# Patient Record
Sex: Female | Born: 1955 | Race: White | Hispanic: No | Smoking: Former smoker
Health system: Southern US, Community
[De-identification: ages and names within clinical notes are randomized; demographics above are authoritative.]

## PROBLEM LIST (undated history)

## (undated) DIAGNOSIS — E039 Hypothyroidism, unspecified: Secondary | ICD-10-CM

## (undated) DIAGNOSIS — K5720 Diverticulitis of large intestine with perforation and abscess without bleeding: Secondary | ICD-10-CM

## (undated) DIAGNOSIS — G4730 Sleep apnea, unspecified: Secondary | ICD-10-CM

## (undated) DIAGNOSIS — K219 Gastro-esophageal reflux disease without esophagitis: Secondary | ICD-10-CM

## (undated) DIAGNOSIS — I10 Essential (primary) hypertension: Secondary | ICD-10-CM

## (undated) DIAGNOSIS — E119 Type 2 diabetes mellitus without complications: Secondary | ICD-10-CM

## (undated) DIAGNOSIS — K5792 Diverticulitis of intestine, part unspecified, without perforation or abscess without bleeding: Secondary | ICD-10-CM

## (undated) HISTORY — PX: VARICOSE VEIN SURGERY: SHX832

---
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
## 2015-04-04 DIAGNOSIS — R202 Paresthesia of skin: Secondary | ICD-10-CM | POA: Insufficient documentation

## 2015-04-04 DIAGNOSIS — M8589 Other specified disorders of bone density and structure, multiple sites: Secondary | ICD-10-CM | POA: Insufficient documentation

## 2015-04-04 DIAGNOSIS — E785 Hyperlipidemia, unspecified: Secondary | ICD-10-CM | POA: Diagnosis present

## 2015-04-04 DIAGNOSIS — K219 Gastro-esophageal reflux disease without esophagitis: Secondary | ICD-10-CM | POA: Diagnosis present

## 2015-04-04 DIAGNOSIS — F339 Major depressive disorder, recurrent, unspecified: Secondary | ICD-10-CM | POA: Diagnosis present

## 2015-04-04 DIAGNOSIS — I872 Venous insufficiency (chronic) (peripheral): Secondary | ICD-10-CM | POA: Insufficient documentation

## 2015-04-04 DIAGNOSIS — G5601 Carpal tunnel syndrome, right upper limb: Secondary | ICD-10-CM | POA: Insufficient documentation

## 2015-04-04 DIAGNOSIS — I10 Essential (primary) hypertension: Secondary | ICD-10-CM | POA: Diagnosis present

## 2015-04-04 DIAGNOSIS — E1140 Type 2 diabetes mellitus with diabetic neuropathy, unspecified: Secondary | ICD-10-CM

## 2015-04-04 DIAGNOSIS — M722 Plantar fascial fibromatosis: Secondary | ICD-10-CM | POA: Insufficient documentation

## 2015-04-04 DIAGNOSIS — J309 Allergic rhinitis, unspecified: Secondary | ICD-10-CM | POA: Insufficient documentation

## 2015-04-04 DIAGNOSIS — G4700 Insomnia, unspecified: Secondary | ICD-10-CM | POA: Diagnosis present

## 2015-07-21 DIAGNOSIS — I83812 Varicose veins of left lower extremities with pain: Secondary | ICD-10-CM | POA: Insufficient documentation

## 2018-01-04 DIAGNOSIS — I8001 Phlebitis and thrombophlebitis of superficial vessels of right lower extremity: Secondary | ICD-10-CM | POA: Insufficient documentation

## 2018-04-18 DIAGNOSIS — K632 Fistula of intestine: Secondary | ICD-10-CM

## 2018-04-18 HISTORY — DX: Fistula of intestine: K63.2

## 2018-07-18 DIAGNOSIS — K5792 Diverticulitis of intestine, part unspecified, without perforation or abscess without bleeding: Secondary | ICD-10-CM

## 2018-07-18 HISTORY — DX: Diverticulitis of intestine, part unspecified, without perforation or abscess without bleeding: K57.92

## 2018-08-01 ENCOUNTER — Encounter (HOSPITAL_BASED_OUTPATIENT_CLINIC_OR_DEPARTMENT_OTHER): Payer: Self-pay

## 2018-08-01 ENCOUNTER — Other Ambulatory Visit: Payer: Self-pay

## 2018-08-01 ENCOUNTER — Emergency Department (HOSPITAL_BASED_OUTPATIENT_CLINIC_OR_DEPARTMENT_OTHER): Payer: PRIVATE HEALTH INSURANCE

## 2018-08-01 ENCOUNTER — Inpatient Hospital Stay (HOSPITAL_BASED_OUTPATIENT_CLINIC_OR_DEPARTMENT_OTHER)
Admission: EM | Admit: 2018-08-01 | Discharge: 2018-08-03 | DRG: 392 | Disposition: A | Discharge status: Home / Self Care | Payer: PRIVATE HEALTH INSURANCE | Attending: Internal Medicine | Admitting: Internal Medicine

## 2018-08-01 DIAGNOSIS — K5792 Diverticulitis of intestine, part unspecified, without perforation or abscess without bleeding: Secondary | ICD-10-CM | POA: Diagnosis present

## 2018-08-01 DIAGNOSIS — I10 Essential (primary) hypertension: Secondary | ICD-10-CM | POA: Diagnosis present

## 2018-08-01 DIAGNOSIS — E785 Hyperlipidemia, unspecified: Secondary | ICD-10-CM | POA: Diagnosis present

## 2018-08-01 DIAGNOSIS — E119 Type 2 diabetes mellitus without complications: Secondary | ICD-10-CM | POA: Diagnosis present

## 2018-08-01 DIAGNOSIS — Z833 Family history of diabetes mellitus: Secondary | ICD-10-CM

## 2018-08-01 DIAGNOSIS — Z888 Allergy status to other drugs, medicaments and biological substances status: Secondary | ICD-10-CM

## 2018-08-01 DIAGNOSIS — K5732 Diverticulitis of large intestine without perforation or abscess without bleeding: Secondary | ICD-10-CM | POA: Diagnosis not present

## 2018-08-01 DIAGNOSIS — K5720 Diverticulitis of large intestine with perforation and abscess without bleeding: Secondary | ICD-10-CM | POA: Diagnosis present

## 2018-08-01 DIAGNOSIS — R1032 Left lower quadrant pain: Secondary | ICD-10-CM | POA: Diagnosis not present

## 2018-08-01 DIAGNOSIS — R112 Nausea with vomiting, unspecified: Secondary | ICD-10-CM | POA: Diagnosis present

## 2018-08-01 DIAGNOSIS — Z8249 Family history of ischemic heart disease and other diseases of the circulatory system: Secondary | ICD-10-CM

## 2018-08-01 DIAGNOSIS — E1140 Type 2 diabetes mellitus with diabetic neuropathy, unspecified: Secondary | ICD-10-CM

## 2018-08-01 HISTORY — DX: Type 2 diabetes mellitus without complications: E11.9

## 2018-08-01 HISTORY — DX: Hypothyroidism, unspecified: E03.9

## 2018-08-01 HISTORY — DX: Diverticulitis of intestine, part unspecified, without perforation or abscess without bleeding: K57.92

## 2018-08-01 HISTORY — DX: Essential (primary) hypertension: I10

## 2018-08-01 LAB — COMPREHENSIVE METABOLIC PANEL
ALT: 33 U/L (ref 0–44)
AST: 19 U/L (ref 15–41)
Albumin: 4.3 g/dL (ref 3.5–5.0)
Alkaline Phosphatase: 79 U/L (ref 38–126)
Anion gap: 11 (ref 5–15)
BUN: 15 mg/dL (ref 8–23)
CO2: 26 mmol/L (ref 22–32)
Calcium: 8.8 mg/dL — ABNORMAL LOW (ref 8.9–10.3)
Chloride: 97 mmol/L — ABNORMAL LOW (ref 98–111)
Creatinine, Ser: 0.54 mg/dL (ref 0.44–1.00)
GFR calc Af Amer: >60 mL/min (ref >60)
GFR calc non Af Amer: >60 mL/min (ref >60)
Glucose, Bld: 125 mg/dL — ABNORMAL HIGH (ref 70–99)
Potassium: 4 mmol/L (ref 3.5–5.1)
Sodium: 134 mmol/L — ABNORMAL LOW (ref 135–145)
Total Bilirubin: 1.2 mg/dL (ref 0.3–1.2)
Total Protein: 8.1 g/dL (ref 6.5–8.1)

## 2018-08-01 LAB — CBC
HCT: 46.6 % — ABNORMAL HIGH (ref 36.0–46.0)
Hemoglobin: 14.8 g/dL (ref 12.0–15.0)
MCH: 26.8 pg (ref 26.0–34.0)
MCHC: 31.8 g/dL (ref 30.0–36.0)
MCV: 84.4 fL (ref 80.0–100.0)
Platelets: 298 10*3/uL (ref 150–400)
RBC: 5.52 MIL/uL — ABNORMAL HIGH (ref 3.87–5.11)
RDW: 14.3 % (ref 11.5–15.5)
WBC: 17.6 10*3/uL — ABNORMAL HIGH (ref 4.0–10.5)
nRBC: 0 % (ref 0.0–0.2)

## 2018-08-01 LAB — URINALYSIS, ROUTINE W REFLEX MICROSCOPIC
Bilirubin Urine: NEGATIVE
Glucose, UA: >=500 mg/dL — AB
Hgb urine dipstick: NEGATIVE
Ketones, ur: NEGATIVE mg/dL
Leukocytes,Ua: NEGATIVE
Nitrite: NEGATIVE
Protein, ur: NEGATIVE mg/dL
Specific Gravity, Urine: 1.015 (ref 1.005–1.030)
pH: 7.5 (ref 5.0–8.0)

## 2018-08-01 LAB — URINALYSIS, MICROSCOPIC (REFLEX): WBC, UA: NONE SEEN WBC/hpf (ref 0–5)

## 2018-08-01 LAB — LIPASE, BLOOD: Lipase: 30 U/L (ref 11–51)

## 2018-08-01 LAB — LACTIC ACID, PLASMA: Lactic Acid, Venous: 1.5 mmol/L (ref 0.5–1.9)

## 2018-08-01 MED ORDER — SODIUM CHLORIDE 0.9 % IV SOLN
Freq: Once | INTRAVENOUS | Status: AC
  Administered 2018-08-01: 21:00:00 via INTRAVENOUS

## 2018-08-01 MED ORDER — METRONIDAZOLE IN NACL 5-0.79 MG/ML-% IV SOLN
500.0000 mg | Freq: Once | INTRAVENOUS | Status: AC
  Administered 2018-08-01: 23:00:00 500 mg via INTRAVENOUS
  Filled 2018-08-01: qty 100

## 2018-08-01 MED ORDER — IOHEXOL 300 MG/ML  SOLN
100.0000 mL | Freq: Once | INTRAMUSCULAR | Status: AC | PRN
  Administered 2018-08-01: 21:00:00 100 mL via INTRAVENOUS

## 2018-08-01 MED ORDER — MORPHINE SULFATE (PF) 4 MG/ML IV SOLN
4.0000 mg | Freq: Once | INTRAVENOUS | Status: AC
  Administered 2018-08-01: 23:00:00 4 mg via INTRAVENOUS
  Filled 2018-08-01: qty 1

## 2018-08-01 MED ORDER — ONDANSETRON HCL 4 MG/2ML IJ SOLN
4.0000 mg | Freq: Once | INTRAMUSCULAR | Status: AC | PRN
  Administered 2018-08-01: 20:00:00 4 mg via INTRAVENOUS
  Filled 2018-08-01: qty 2

## 2018-08-01 MED ORDER — MORPHINE SULFATE (PF) 2 MG/ML IV SOLN
2.0000 mg | Freq: Once | INTRAVENOUS | Status: AC
  Administered 2018-08-01: 21:00:00 2 mg via INTRAVENOUS
  Filled 2018-08-01: qty 1

## 2018-08-01 MED ORDER — LEVOFLOXACIN IN D5W 750 MG/150ML IV SOLN
750.0000 mg | Freq: Once | INTRAVENOUS | Status: AC
  Administered 2018-08-01: 750 mg via INTRAVENOUS
  Filled 2018-08-01: qty 150

## 2018-08-01 NOTE — Plan of Care (Signed)
Acute uncomplicated diverticulitis.  I recommended against admission if there wernt any complicating factors given the current COVID-19 pandemic and risk of nosocomial infection, but EDP still wants admission for pain control.  As I am not there and cannot evaluate the patient, will therefore accept to Med surg, obs.

## 2018-08-01 NOTE — ED Provider Notes (Signed)
[facility] EMERGENCY DEPARTMENT Provider Note   CSN: [id number] Arrival date & time: 08/01/18  1949    History   Chief Complaint Chief Complaint  Patient presents with  . Abdominal Pain    HPI Kristie Allen is a 63 y.o. female.     HPI Patient has had approximately 3 days of increasing left lower abdominal pain.  On at onset it was moderate discomfort.  She reports it has gotten significantly more painful.  Now it aches from her lower left abdomen up to the mid abdomen.  No vomiting, no diarrhea.  No history of prior similar abdominal pain.  No pain or burning with urgency urination.  No flank pain. Past Medical History:  Diagnosis Date  . COPD (chronic obstructive pulmonary disease) ([place])   . Diabetes mellitus without complication ([place])   . Hypertension     Patient Active Problem List   Diagnosis Date Noted  . Acute diverticulitis 08/01/2018       OB History   No obstetric history on file.      Home Medications    Prior to Admission medications   Not on File    Family History No family history on file.  Social History Social History   Tobacco Use  . Smoking status: Never Smoker  Substance Use Topics  . Alcohol use: Never    Frequency: Never  . Drug use: Never     Allergies   Patient has no allergy information on record.   Review of Systems Review of Systems 10 Systems reviewed and are negative for acute change except as noted in the HPI.   Physical Exam Updated Vital Signs BP (!) 142/75   Pulse 97   Temp 98.7 F (37.1 C) (Oral)   Resp 19   Ht 5\' 5"  (1.651 m)   Wt 117.9 kg   SpO2 91%   BMI 43.27 kg/m   Physical Exam Constitutional:      Comments: Alert and appropriate.  Uncomfortable in appearance.  HENT:     Head: Normocephalic and atraumatic.  Eyes:     Extraocular Movements: Extraocular movements intact.     Conjunctiva/sclera: Conjunctivae normal.  Cardiovascular:     Rate and Rhythm: Normal rate and regular  rhythm.  Pulmonary:     Effort: Pulmonary effort is normal.     Breath sounds: Normal breath sounds.  Abdominal:     Comments: Severe pain left lower quadrant with voluntary guarding.  Moderate pain left mid quadrant.  Right upper quadrant nontender.  Right lower quadrant radiates pain to the left.  Musculoskeletal: Normal range of motion.        General: No swelling or tenderness.     Right lower leg: No edema.     Left lower leg: No edema.  Skin:    General: Skin is warm and dry.  Neurological:     General: No focal deficit present.     Mental Status: She is oriented to person, place, and time.     Coordination: Coordination normal.  Psychiatric:        Mood and Affect: Mood normal.      ED Treatments / Results  Labs (all labs ordered are listed, but only abnormal results are displayed) Labs Reviewed  COMPREHENSIVE METABOLIC PANEL - Abnormal; Notable for the following components:      Result Value   Sodium 134 (*)    Chloride 97 (*)    Glucose, Bld 125 (*)    Calcium 8.8 (*)  All other components within normal limits  CBC - Abnormal; Notable for the following components:   WBC 17.6 (*)    RBC 5.52 (*)    HCT 46.6 (*)    All other components within normal limits  URINALYSIS, ROUTINE W REFLEX MICROSCOPIC - Abnormal; Notable for the following components:   Glucose, UA >=500 (*)    All other components within normal limits  URINALYSIS, MICROSCOPIC (REFLEX) - Abnormal; Notable for the following components:   Bacteria, UA RARE (*)    All other components within normal limits  LIPASE, BLOOD  LACTIC ACID, PLASMA  LACTIC ACID, PLASMA    EKG None  Radiology Ct Abdomen Pelvis W Contrast  Result Date: 08/01/2018 CLINICAL DATA:  63 year old female with abdominal pain and vomit EXAM: CT ABDOMEN AND PELVIS WITH CONTRAST TECHNIQUE: Multidetector CT imaging of the abdomen and pelvis was performed using the standard protocol following bolus administration of intravenous  contrast. CONTRAST:  154mL OMNIPAQUE IOHEXOL 300 MG/ML  SOLN COMPARISON:  None. FINDINGS: Lower chest: No acute finding Hepatobiliary: Unremarkable liver.  Unremarkable gallbladder Pancreas: Unremarkable Spleen: Unremarkable Adrenals/Urinary Tract: Unremarkable appearance of the adrenal glands. No evidence of hydronephrosis of the right or left kidney. No nephrolithiasis. Unremarkable course of the bilateral ureters. Unremarkable appearance of the urinary bladder. Stomach/Bowel: Unremarkable stomach. Hiatal hernia. Small bowel without distention or transition point. Normal appendix. Moderate stool burden of the proximal colon. Circumferential inflammatory changes in the proximal segment of sigmoid colon. This is in a segment of significant diverticular change. Inflammatory changes of the adjacent fat. Free fluid on the anti mesenteric border of the colon, with fluid layering dependently in the pelvis. No free air or abscess. Vascular/Lymphatic: Mild atherosclerotic changes. No aneurysm. Bilateral iliac arteries and proximal femoral arteries patent. Reproductive: Unremarkable uterus Other: None Musculoskeletal: No displaced fracture. Degenerative changes of L5-S1. IMPRESSION: Acute diverticulitis, uncomplicated. Hiatal hernia. Electronically Signed   By: Corrie Mckusick D.O.   On: 08/01/2018 21:45    Procedures Procedures (including critical care time)  Medications Ordered in ED Medications  levofloxacin (LEVAQUIN) IVPB 750 mg (has no administration in time range)  metroNIDAZOLE (FLAGYL) IVPB 500 mg ( Intravenous Rate/Dose Verify 08/01/18 2312)  ondansetron (ZOFRAN) injection 4 mg (4 mg Intravenous Given 08/01/18 2012)  morphine 2 MG/ML injection 2 mg (2 mg Intravenous Given 08/01/18 2038)  0.9 %  sodium chloride infusion ( Intravenous New Bag/Given 08/01/18 2049)  iohexol (OMNIPAQUE) 300 MG/ML solution 100 mL (100 mLs Intravenous Contrast Given 08/01/18 2124)  morphine 4 MG/ML injection 4 mg (4 mg Intravenous  Given 08/01/18 2259)     Initial Impression / Assessment and Plan / ED Course  I have reviewed the triage vital signs and the nursing notes.  Pertinent labs & imaging results that were available during my care of the patient were reviewed by me and considered in my medical decision making (see chart for details).         Consult: Reviewed with Dr. Barry Dienes.  Dr. Barry Dienes has examined the CT.  At this time, pelvic fluid identified on CT not likely to be from abscess.  Proceed with admission with antibiotics, fluids and pain control.  Reconsult surgery tomorrow if concerns.  Consult: Dr. Alcario Drought accepts for admission.  Patient presents with left lower quadrant pain that has been worsening.  CT shows diverticulitis, patient has 17,000 white count.  She does have significant pain to palpation.  Patient needing pain control with morphine.  Final Clinical Impressions(s) / ED Diagnoses  Final diagnoses:  Diverticulitis    ED Discharge Orders    None       Charlesetta Shanks, MD 08/01/18 2326

## 2018-08-01 NOTE — ED Triage Notes (Signed)
Pt reports RLQ and LLQ pain. Nasuea/ 1 episode of vomiting. Denies diarrhea/constipation. Pt denies urinary symptoms. Symptoms began Monday.

## 2018-08-01 NOTE — ED Notes (Signed)
Report given to [facility] w/ [facility]

## 2018-08-02 ENCOUNTER — Encounter (HOSPITAL_COMMUNITY): Payer: Self-pay | Admitting: Internal Medicine

## 2018-08-02 DIAGNOSIS — E119 Type 2 diabetes mellitus without complications: Secondary | ICD-10-CM | POA: Diagnosis not present

## 2018-08-02 DIAGNOSIS — K5792 Diverticulitis of intestine, part unspecified, without perforation or abscess without bleeding: Secondary | ICD-10-CM | POA: Diagnosis not present

## 2018-08-02 DIAGNOSIS — E785 Hyperlipidemia, unspecified: Secondary | ICD-10-CM | POA: Diagnosis not present

## 2018-08-02 DIAGNOSIS — I10 Essential (primary) hypertension: Secondary | ICD-10-CM | POA: Diagnosis not present

## 2018-08-02 DIAGNOSIS — K5732 Diverticulitis of large intestine without perforation or abscess without bleeding: Secondary | ICD-10-CM | POA: Diagnosis present

## 2018-08-02 DIAGNOSIS — Z833 Family history of diabetes mellitus: Secondary | ICD-10-CM | POA: Diagnosis not present

## 2018-08-02 DIAGNOSIS — Z8249 Family history of ischemic heart disease and other diseases of the circulatory system: Secondary | ICD-10-CM | POA: Diagnosis not present

## 2018-08-02 DIAGNOSIS — R1032 Left lower quadrant pain: Secondary | ICD-10-CM | POA: Diagnosis present

## 2018-08-02 DIAGNOSIS — R112 Nausea with vomiting, unspecified: Secondary | ICD-10-CM | POA: Diagnosis present

## 2018-08-02 DIAGNOSIS — Z888 Allergy status to other drugs, medicaments and biological substances status: Secondary | ICD-10-CM | POA: Diagnosis not present

## 2018-08-02 LAB — GLUCOSE, CAPILLARY
Glucose-Capillary: 162 mg/dL — ABNORMAL HIGH (ref 70–99)
Glucose-Capillary: 113 mg/dL — ABNORMAL HIGH (ref 70–99)
Glucose-Capillary: 102 mg/dL — ABNORMAL HIGH (ref 70–99)
Glucose-Capillary: 159 mg/dL — ABNORMAL HIGH (ref 70–99)

## 2018-08-02 LAB — CBC
HCT: 43.2 % (ref 36.0–46.0)
Hemoglobin: 13.7 g/dL (ref 12.0–15.0)
MCH: 26.1 pg (ref 26.0–34.0)
MCHC: 31.7 g/dL (ref 30.0–36.0)
MCV: 82.4 fL (ref 80.0–100.0)
Platelets: 239 10*3/uL (ref 150–400)
RBC: 5.24 MIL/uL — ABNORMAL HIGH (ref 3.87–5.11)
RDW: 14.1 % (ref 11.5–15.5)
WBC: 18.1 10*3/uL — ABNORMAL HIGH (ref 4.0–10.5)
nRBC: 0 % (ref 0.0–0.2)

## 2018-08-02 LAB — BASIC METABOLIC PANEL
Anion gap: 14 (ref 5–15)
BUN: 10 mg/dL (ref 8–23)
CO2: 22 mmol/L (ref 22–32)
Calcium: 8.8 mg/dL — ABNORMAL LOW (ref 8.9–10.3)
Chloride: 98 mmol/L (ref 98–111)
Creatinine, Ser: 0.7 mg/dL (ref 0.44–1.00)
GFR calc Af Amer: >60 mL/min (ref >60)
GFR calc non Af Amer: >60 mL/min (ref >60)
Glucose, Bld: 160 mg/dL — ABNORMAL HIGH (ref 70–99)
Potassium: 3.6 mmol/L (ref 3.5–5.1)
Sodium: 134 mmol/L — ABNORMAL LOW (ref 135–145)

## 2018-08-02 LAB — HIV ANTIBODY (ROUTINE TESTING W REFLEX): HIV Screen 4th Generation wRfx: NONREACTIVE

## 2018-08-02 MED ORDER — EZETIMIBE 10 MG PO TABS
10.0000 mg | ORAL_TABLET | Freq: Every day | ORAL | Status: DC
  Administered 2018-08-02 – 2018-08-03 (×2): 10 mg via ORAL
  Filled 2018-08-02 (×2): qty 1

## 2018-08-02 MED ORDER — ONDANSETRON HCL 4 MG/2ML IJ SOLN
INTRAMUSCULAR | Status: AC
  Filled 2018-08-02: qty 2

## 2018-08-02 MED ORDER — PANTOPRAZOLE SODIUM 40 MG PO TBEC
40.0000 mg | DELAYED_RELEASE_TABLET | Freq: Every day | ORAL | Status: DC
  Administered 2018-08-02 – 2018-08-03 (×2): 40 mg via ORAL
  Filled 2018-08-02 (×2): qty 1

## 2018-08-02 MED ORDER — CIPROFLOXACIN IN D5W 400 MG/200ML IV SOLN: 400.0000 mg | Freq: Two times a day (BID) | INTRAVENOUS | Status: DC

## 2018-08-02 MED ORDER — ENOXAPARIN SODIUM 40 MG/0.4ML ~~LOC~~ SOLN
40.0000 mg | SUBCUTANEOUS | Status: DC
  Filled 2018-08-02: qty 0.4

## 2018-08-02 MED ORDER — METRONIDAZOLE 500 MG PO TABS
500.0000 mg | ORAL_TABLET | Freq: Three times a day (TID) | ORAL | Status: DC
  Administered 2018-08-02 – 2018-08-03 (×5): 500 mg via ORAL
  Filled 2018-08-02 (×5): qty 1

## 2018-08-02 MED ORDER — LEVOTHYROXINE SODIUM 25 MCG PO TABS
125.0000 ug | ORAL_TABLET | Freq: Every day | ORAL | Status: DC
  Administered 2018-08-02 – 2018-08-03 (×2): 125 ug via ORAL
  Filled 2018-08-02 (×2): qty 1

## 2018-08-02 MED ORDER — SODIUM CHLORIDE 0.9 % IV SOLN
2.0000 g | INTRAVENOUS | Status: DC
  Administered 2018-08-02: 2 g via INTRAVENOUS
  Filled 2018-08-02 (×2): qty 20

## 2018-08-02 MED ORDER — MORPHINE SULFATE (PF) 2 MG/ML IV SOLN: 2.0000 mg | INTRAVENOUS | Status: DC | PRN

## 2018-08-02 MED ORDER — ACETAMINOPHEN 650 MG RE SUPP: 650.0000 mg | Freq: Four times a day (QID) | RECTAL | Status: DC | PRN

## 2018-08-02 MED ORDER — ATORVASTATIN CALCIUM 80 MG PO TABS
80.0000 mg | ORAL_TABLET | Freq: Every day | ORAL | Status: DC
  Administered 2018-08-02: 19:00:00 80 mg via ORAL
  Filled 2018-08-02 (×2): qty 1

## 2018-08-02 MED ORDER — ONDANSETRON HCL 4 MG PO TABS: 4.0000 mg | ORAL_TABLET | Freq: Four times a day (QID) | ORAL | Status: DC | PRN

## 2018-08-02 MED ORDER — MELOXICAM 7.5 MG PO TABS
7.5000 mg | ORAL_TABLET | Freq: Every day | ORAL | Status: DC
  Filled 2018-08-02: qty 1

## 2018-08-02 MED ORDER — ONDANSETRON HCL 4 MG/2ML IJ SOLN: 4.0000 mg | Freq: Four times a day (QID) | INTRAMUSCULAR | Status: DC | PRN

## 2018-08-02 MED ORDER — ONDANSETRON HCL 4 MG/2ML IJ SOLN
4.0000 mg | Freq: Once | INTRAMUSCULAR | Status: AC | PRN
  Administered 2018-08-02: 4 mg via INTRAVENOUS

## 2018-08-02 MED ORDER — INSULIN ASPART 100 UNIT/ML ~~LOC~~ SOLN
0.0000 [IU] | Freq: Three times a day (TID) | SUBCUTANEOUS | Status: DC
  Administered 2018-08-03: 13:00:00 2 [IU] via SUBCUTANEOUS

## 2018-08-02 MED ORDER — LISINOPRIL 20 MG PO TABS
20.0000 mg | ORAL_TABLET | Freq: Every day | ORAL | Status: DC
  Administered 2018-08-02 – 2018-08-03 (×2): 20 mg via ORAL
  Filled 2018-08-02 (×2): qty 1

## 2018-08-02 MED ORDER — INSULIN ASPART 100 UNIT/ML ~~LOC~~ SOLN
0.0000 [IU] | SUBCUTANEOUS | Status: DC
  Administered 2018-08-02: 1 [IU] via SUBCUTANEOUS

## 2018-08-02 MED ORDER — ACETAMINOPHEN 325 MG PO TABS
650.0000 mg | ORAL_TABLET | Freq: Four times a day (QID) | ORAL | Status: DC | PRN
  Administered 2018-08-02 – 2018-08-03 (×4): 650 mg via ORAL
  Filled 2018-08-02 (×4): qty 2

## 2018-08-02 NOTE — Progress Notes (Addendum)
PROGRESS NOTE    Kristie Allen  KJZ:[id number] DOB: 1955-05-25 DOA: 08/01/2018 PCP: [name]., MD  Brief Narrative:Kristie Allen is a 63 y.o. female with medical history significant of DM2, HTN, HLD.  Patient presents to the ED at [facility] with c/o LLQ abd pain.  Moderate after its onset 3 days ago.  Significantly worsened since then. CT reveals uncomplicated diverticulitis.  WBC 17.6k, Tm of 100.   Assessment & Plan:   ACute diverticulitis -Admitted earlier this morning -some imporvement in symptoms, still has severe nausea, tenderness and Leukocytosis of 18K, I do not think she is safe for discharge home today and oral antibiotics yet - continue clear liquids, IV ceftriaxone and Flagyl -Continue supportive care -Home tomorrow if stable/improving  Type 2 diabetes mellitus -Hold oral hypoglycemics, SSI  Hypertension -Continue lisinopril, hold HCTZ  dyslipidemia -continue Statins    DVT prophylaxis: Lovenox Code Status: Full Family Communication: No family in room Disposition Plan: Home tomorrow if improving   Procedures:   Antimicrobials:    Subjective: -feels better, c/o nausea, has LLQ abd pain  Objective: Vitals:   08/01/18 2000 08/01/18 2238 08/02/18 0115 08/02/18 0419  BP:  (!) 142/75 126/69 122/70  Pulse:  97 91 92  Resp:  19 17 18   Temp:   100.2 F (37.9 C) 99.3 F (37.4 C)  TempSrc:   Oral Oral  SpO2:  91% 93% 96%  Weight: 117.9 kg  117.9 kg   Height: 5\' 5"  (1.651 m)  5\' 5"  (1.651 m)     Intake/Output Summary (Last 24 hours) at 08/02/2018 1236 Last data filed at 08/02/2018 0400 Gross per 24 hour  Intake 239.73 ml  Output 500 ml  Net -260.27 ml   Filed Weights   08/01/18 2000 08/02/18 0115  Weight: 117.9 kg 117.9 kg    Examination:  General exam: Appears calm and comfortable  Respiratory system: Clear to auscultation. Respiratory effort normal. Cardiovascular system: S1 & S2 heard, RRR. No JVD, murmurs, rubs, gallops Gastrointestinal  system: Abdomen is nondistended, soft and  LLQ tenderness, + BS. Central nervous system: Alert and oriented. No focal neurological deficits. Extremities: Symmetric 5 x 5 power. Skin: No rashes, lesions or ulcers Psychiatry: Judgement and insight appear normal. Mood & affect appropriate.     Data Reviewed:   CBC: Recent Labs  Lab 08/01/18 2007 08/02/18 0514  WBC 17.6* 18.1*  HGB 14.8 13.7  HCT 46.6* 43.2  MCV 84.4 82.4  PLT 298 948   Basic Metabolic Panel: Recent Labs  Lab 08/01/18 2037 08/02/18 0514  NA 134* 134*  K 4.0 3.6  CL 97* 98  CO2 26 22  GLUCOSE 125* 160*  BUN 15 10  CREATININE 0.54 0.70  CALCIUM 8.8* 8.8*   GFR: Estimated Creatinine Clearance: 94.9 mL/min (by C-G formula based on SCr of 0.7 mg/dL). Liver Function Tests: Recent Labs  Lab 08/01/18 2037  AST 19  ALT 33  ALKPHOS 79  BILITOT 1.2  PROT 8.1  ALBUMIN 4.3   Recent Labs  Lab 08/01/18 2037  LIPASE 30   No results for input(s): AMMONIA in the last 168 hours. Coagulation Profile: No results for input(s): INR, PROTIME in the last 168 hours. Cardiac Enzymes: No results for input(s): CKTOTAL, CKMB, CKMBINDEX, TROPONINI in the last 168 hours. BNP (last 3 results) No results for input(s): PROBNP in the last 8760 hours. HbA1C: No results for input(s): HGBA1C in the last 72 hours. CBG: Recent Labs  Lab 08/02/18 0418 08/02/18 0759  GLUCAP  162* 113*   Lipid Profile: No results for input(s): CHOL, HDL, LDLCALC, TRIG, CHOLHDL, LDLDIRECT in the last 72 hours. Thyroid Function Tests: No results for input(s): TSH, T4TOTAL, FREET4, T3FREE, THYROIDAB in the last 72 hours. Anemia Panel: No results for input(s): VITAMINB12, FOLATE, FERRITIN, TIBC, IRON, RETICCTPCT in the last 72 hours. Urine analysis:    Component Value Date/Time   COLORURINE YELLOW 08/01/2018 2007   APPEARANCEUR CLEAR 08/01/2018 2007   LABSPEC 1.015 08/01/2018 2007   PHURINE 7.5 08/01/2018 2007   GLUCOSEU >=500 (A)  08/01/2018 2007   HGBUR NEGATIVE 08/01/2018 2007   [place] NEGATIVE 08/01/2018 2007   [place] NEGATIVE 08/01/2018 2007   PROTEINUR NEGATIVE 08/01/2018 2007   NITRITE NEGATIVE 08/01/2018 2007   LEUKOCYTESUR NEGATIVE 08/01/2018 2007   Sepsis Labs: @LABRCNTIP (procalcitonin:4,lacticidven:4)  )No results found for this or any previous visit (from the past 240 hour(s)).       Radiology Studies: Ct Abdomen Pelvis W Contrast  Result Date: 08/01/2018 CLINICAL DATA:  63 year old female with abdominal pain and vomit EXAM: CT ABDOMEN AND PELVIS WITH CONTRAST TECHNIQUE: Multidetector CT imaging of the abdomen and pelvis was performed using the standard protocol following bolus administration of intravenous contrast. CONTRAST:  169mL OMNIPAQUE IOHEXOL 300 MG/ML  SOLN COMPARISON:  None. FINDINGS: Lower chest: No acute finding Hepatobiliary: Unremarkable liver.  Unremarkable gallbladder Pancreas: Unremarkable Spleen: Unremarkable Adrenals/Urinary Tract: Unremarkable appearance of the adrenal glands. No evidence of hydronephrosis of the right or left kidney. No nephrolithiasis. Unremarkable course of the bilateral ureters. Unremarkable appearance of the urinary bladder. Stomach/Bowel: Unremarkable stomach. Hiatal hernia. Small bowel without distention or transition point. Normal appendix. Moderate stool burden of the proximal colon. Circumferential inflammatory changes in the proximal segment of sigmoid colon. This is in a segment of significant diverticular change. Inflammatory changes of the adjacent fat. Free fluid on the anti mesenteric border of the colon, with fluid layering dependently in the pelvis. No free air or abscess. Vascular/Lymphatic: Mild atherosclerotic changes. No aneurysm. Bilateral iliac arteries and proximal femoral arteries patent. Reproductive: Unremarkable uterus Other: None Musculoskeletal: No displaced fracture. Degenerative changes of L5-S1. IMPRESSION: Acute diverticulitis,  uncomplicated. Hiatal hernia. Electronically Signed   By: Corrie Mckusick D.O.   On: 08/01/2018 21:45        Scheduled Meds:  atorvastatin  80 mg Oral q1800   enoxaparin (LOVENOX) injection  40 mg Subcutaneous Q24H   ezetimibe  10 mg Oral Daily   insulin aspart  0-9 Units Subcutaneous Q4H   levothyroxine  125 mcg Oral QAC breakfast   lisinopril  20 mg Oral Daily   meloxicam  7.5 mg Oral Daily   metroNIDAZOLE  500 mg Oral Q8H   pantoprazole  40 mg Oral Daily   Continuous Infusions:  cefTRIAXone (ROCEPHIN)  IV       LOS: 0 days    Time spent: 3min    Domenic Polite, MD [facility] Page via [email or web address], password [email or web address] After 7PM please contact night-coverage  08/02/2018, 12:36 PM

## 2018-08-02 NOTE — H&P (Signed)
History and Physical    Kristie Allen XNT:[id number] DOB: [birth year] DOA: 08/01/2018  PCP: [name]., MD  Patient coming from: Home  I have personally briefly reviewed patient's old medical records in [place]  Chief Complaint: Abd pain  HPI: Kristie Allen is a 63 y.o. female with medical history significant of DM2, HTN, HLD.  Patient presents to the ED at [facility] with c/o LLQ abd pain.  Moderate after its onset 3 days ago.  Significantly worsened since then.  No vomiting, no diarrhea, no dysuria, no flank pain, no history of similar symptoms.   ED Course: CT reveals uncomplicated diverticulitis.  WBC 17.6k, Tm of 100.2  EDP requests admit for pain control.   Review of Systems: As per HPI otherwise 10 point review of systems negative.   Past Medical History:  Diagnosis Date  . Diabetes mellitus without complication ([place])   . Hypertension   . Hypothyroidism     History reviewed. No pertinent surgical history.   reports that she has never smoked. She does not have any smokeless tobacco history on file. She reports that she does not drink alcohol or use drugs.  Allergies  Allergen Reactions  . Bisphosphonates     Family History  Problem Relation Age of Onset  . Anuerysm Mother   . Diabetes Mother   . Hypertension Mother   . Aneurysm Sister   . Diabetes Sister   . Aneurysm Brother   . Diabetes Brother   . Hypertension Brother   . Diabetes Father   . Heart attack Father   . Hypertension Father      Prior to Admission medications   Not on File    Physical Exam: Vitals:   08/01/18 1954 08/01/18 2000 08/01/18 2238 08/02/18 0115  BP: (!) 168/80  (!) 142/75 126/69  Pulse: 90  97 91  Resp: 18  19 17   Temp: 98.7 F (37.1 C)   100.2 F (37.9 C)  TempSrc: Oral   Oral  SpO2: 97%  91% 93%  Weight:  117.9 kg  117.9 kg  Height:  5\' 5"  (1.651 m)  5\' 5"  (1.651 m)    Constitutional: NAD, calm, comfortable Eyes: PERRL, lids and conjunctivae normal ENMT:  Mucous membranes are moist. Posterior pharynx clear of any exudate or lesions.Normal dentition.  Neck: normal, supple, no masses, no thyromegaly Respiratory: clear to auscultation bilaterally, no wheezing, no crackles. Normal respiratory effort. No accessory muscle use.  Cardiovascular: Regular rate and rhythm, no murmurs / rubs / gallops. No extremity edema. 2+ pedal pulses. No carotid bruits.  Abdomen: TTP mostly in LLQ Musculoskeletal: no clubbing / cyanosis. No joint deformity upper and lower extremities. Good ROM, no contractures. Normal muscle tone.  Skin: no rashes, lesions, ulcers. No induration Neurologic: CN 2-12 grossly intact. Sensation intact, DTR normal. Strength 5/5 in all 4.  Psychiatric: Normal judgment and insight. Alert and oriented x 3. Normal mood.    Labs on Admission: I have personally reviewed following labs and imaging studies  CBC: Recent Labs  Lab 08/01/18 2007  WBC 17.6*  HGB 14.8  HCT 46.6*  MCV 84.4  PLT 967   Basic Metabolic Panel: Recent Labs  Lab 08/01/18 2037  NA 134*  K 4.0  CL 97*  CO2 26  GLUCOSE 125*  BUN 15  CREATININE 0.54  CALCIUM 8.8*   GFR: Estimated Creatinine Clearance: 94.9 mL/min (by C-G formula based on SCr of 0.54 mg/dL). Liver Function Tests: Recent Labs  Lab 08/01/18 2037  AST 19  ALT 33  ALKPHOS 79  BILITOT 1.2  PROT 8.1  ALBUMIN 4.3   Recent Labs  Lab 08/01/18 2037  LIPASE 30   No results for input(s): AMMONIA in the last 168 hours. Coagulation Profile: No results for input(s): INR, PROTIME in the last 168 hours. Cardiac Enzymes: No results for input(s): CKTOTAL, CKMB, CKMBINDEX, TROPONINI in the last 168 hours. BNP (last 3 results) No results for input(s): PROBNP in the last 8760 hours. HbA1C: No results for input(s): HGBA1C in the last 72 hours. CBG: No results for input(s): GLUCAP in the last 168 hours. Lipid Profile: No results for input(s): CHOL, HDL, LDLCALC, TRIG, CHOLHDL, LDLDIRECT in the  last 72 hours. Thyroid Function Tests: No results for input(s): TSH, T4TOTAL, FREET4, T3FREE, THYROIDAB in the last 72 hours. Anemia Panel: No results for input(s): VITAMINB12, FOLATE, FERRITIN, TIBC, IRON, RETICCTPCT in the last 72 hours. Urine analysis:    Component Value Date/Time   COLORURINE YELLOW 08/01/2018 2007   APPEARANCEUR CLEAR 08/01/2018 2007   LABSPEC 1.015 08/01/2018 2007   PHURINE 7.5 08/01/2018 2007   GLUCOSEU >=500 (A) 08/01/2018 2007   HGBUR NEGATIVE 08/01/2018 2007   [place] NEGATIVE 08/01/2018 2007   [place] NEGATIVE 08/01/2018 2007   PROTEINUR NEGATIVE 08/01/2018 2007   NITRITE NEGATIVE 08/01/2018 2007   LEUKOCYTESUR NEGATIVE 08/01/2018 2007    Radiological Exams on Admission: Ct Abdomen Pelvis W Contrast  Result Date: 08/01/2018 CLINICAL DATA:  63 year old female with abdominal pain and vomit EXAM: CT ABDOMEN AND PELVIS WITH CONTRAST TECHNIQUE: Multidetector CT imaging of the abdomen and pelvis was performed using the standard protocol following bolus administration of intravenous contrast. CONTRAST:  157mL OMNIPAQUE IOHEXOL 300 MG/ML  SOLN COMPARISON:  None. FINDINGS: Lower chest: No acute finding Hepatobiliary: Unremarkable liver.  Unremarkable gallbladder Pancreas: Unremarkable Spleen: Unremarkable Adrenals/Urinary Tract: Unremarkable appearance of the adrenal glands. No evidence of hydronephrosis of the right or left kidney. No nephrolithiasis. Unremarkable course of the bilateral ureters. Unremarkable appearance of the urinary bladder. Stomach/Bowel: Unremarkable stomach. Hiatal hernia. Small bowel without distention or transition point. Normal appendix. Moderate stool burden of the proximal colon. Circumferential inflammatory changes in the proximal segment of sigmoid colon. This is in a segment of significant diverticular change. Inflammatory changes of the adjacent fat. Free fluid on the anti mesenteric border of the colon, with fluid layering dependently  in the pelvis. No free air or abscess. Vascular/Lymphatic: Mild atherosclerotic changes. No aneurysm. Bilateral iliac arteries and proximal femoral arteries patent. Reproductive: Unremarkable uterus Other: None Musculoskeletal: No displaced fracture. Degenerative changes of L5-S1. IMPRESSION: Acute diverticulitis, uncomplicated. Hiatal hernia. Electronically Signed   By: Corrie Mckusick D.O.   On: 08/01/2018 21:45    EKG: Independently reviewed.  Assessment/Plan Principal Problem:   Acute diverticulitis Active Problems:   DM2 (diabetes mellitus, type 2) (HCC)   HLD (hyperlipidemia)   HTN (hypertension)    1. Uncomplicated diverticulitis - 1. Rocephin / flagyl 2. Morphine PRN pain 3. zofran PRN nausea 4. Tylenol PRN fever 5. Clear liquid diet 2. DM2 - 1. Sensitive SSI Q4H 2. Holding home PO hypoglycemics 3. HLD - 1. Cont statin and zetia 4. HTN - 1. Cont Lisinopril 2. Hold HCTZ  DVT prophylaxis: Lovenox Code Status: Full Family Communication: No family in room Disposition Plan: Home after admit Consults called: None Admission status: Place in 26     Matai Carpenito, [place] [facility]  How to contact the [facility] Attending or Consulting provider [place] or covering  provider during after hours [place], for this patient?  1. Check the care team in [facility] and look for a) attending/consulting [facility] provider listed and b) the [facility] team listed 2. Log into [email or web address]  Amion Physician Scheduling and messaging for groups and whole hospitals  On call and physician scheduling software for group practices, residents, hospitalists and other medical providers for call, clinic, rotation and shift schedules. On[employer] is a hospital-wide system for scheduling doctors and paging doctors on call. EasyPlot is for scientific plotting and data analysis.  [email or web address]  and use [place]'s universal password to access. If you do not have the password, please contact the hospital operator.   3. Locate the [facility] provider you are looking for under [facility] and page to a number that you can be directly reached. 4. If you still have difficulty reaching the provider, please page the [facility] (Director on Call) for the Hospitalists listed on amion for assistance.  08/02/2018, 3:21 AM

## 2018-08-02 NOTE — Plan of Care (Signed)

## 2018-08-02 NOTE — ED Notes (Signed)
Report given to Kaitlyn, RN

## 2018-08-02 NOTE — Progress Notes (Signed)
Pharmacy Antibiotic Note  Kristie Allen is a 63 y.o. female admitted on 08/01/2018 with diverticulitis.  Pharmacy has been consulted for cipro dosing. Levaquin 750 mg IV given ~ 22:00 4/15  Plan: Cipro 400 mg IV q12 hours starting today at 22:00 F/u clinical course  Height: 5\' 5"  (165.1 cm) Weight: 260 lb (117.9 kg) IBW/kg (Calculated) : 57  Temp (24hrs), Avg:99.5 F (37.5 C), Min:98.7 F (37.1 C), Max:100.2 F (37.9 C)  Recent Labs  Lab 08/01/18 2007 08/01/18 2037  WBC 17.6*  --   CREATININE  --  0.54  LATICACIDVEN  --  1.5    Estimated Creatinine Clearance: 94.9 mL/min (by C-G formula based on SCr of 0.54 mg/dL).    Not on File   Thank you for allowing pharmacy to be a part of this patient's care.  Excell Seltzer Poteet 08/02/2018 3:00 AM

## 2018-08-02 NOTE — ED Notes (Signed)
[facility] arrived to transport [facility] to [facility] 

## 2018-08-02 NOTE — Plan of Care (Signed)

## 2018-08-03 LAB — CBC
HCT: 45.2 % (ref 36.0–46.0)
Hemoglobin: 14 g/dL (ref 12.0–15.0)
MCH: 26 pg (ref 26.0–34.0)
MCHC: 31 g/dL (ref 30.0–36.0)
MCV: 83.9 fL (ref 80.0–100.0)
Platelets: 255 10*3/uL (ref 150–400)
RBC: 5.39 MIL/uL — ABNORMAL HIGH (ref 3.87–5.11)
RDW: 14.2 % (ref 11.5–15.5)
WBC: 15.9 10*3/uL — ABNORMAL HIGH (ref 4.0–10.5)
nRBC: 0 % (ref 0.0–0.2)

## 2018-08-03 LAB — GLUCOSE, CAPILLARY
Glucose-Capillary: 117 mg/dL — ABNORMAL HIGH (ref 70–99)
Glucose-Capillary: 165 mg/dL — ABNORMAL HIGH (ref 70–99)

## 2018-08-03 MED ORDER — AMOXICILLIN-POT CLAVULANATE 875-125 MG PO TABS: 1.0000 | ORAL_TABLET | Freq: Two times a day (BID) | ORAL | 0 refills | Status: DC

## 2018-08-03 MED ORDER — SENNA 8.6 MG PO TABS: 1.0000 | ORAL_TABLET | Freq: Every day | ORAL | 0 refills | Status: AC | PRN

## 2018-08-03 NOTE — Progress Notes (Signed)
Discharge paperwork and instructions given to pt. Pt not in distress and tolerated well. 

## 2018-08-03 NOTE — Discharge Summary (Signed)
Physician Discharge Summary  Kristie Allen TFT:[id number] DOB: May 13, 1955 DOA: 08/01/2018  PCP: [name]., MD  Admit date: 08/01/2018 Discharge date: 08/03/2018  Time spent: 35 minutes  Recommendations for Outpatient Follow-up:  1. PCP in 2 weeks 2. Gastroenterology in 1 month   Discharge Diagnoses:  Principal Problem:   Acute diverticulitis Active Problems:   DM2 (diabetes mellitus, type 2) ([place])   HLD (hyperlipidemia)   HTN (hypertension)   Diverticulitis   Discharge Condition: stable  Diet recommendation: Carb modified  Filed Weights   08/01/18 2000 08/02/18 0115  Weight: 117.9 kg 117.9 kg    History of present illness:  Kristie Allen a 63 y.o.femalewith medical history significant ofDM2, HTN, HLD. Patient presents to the ED at [facility] with c/o LLQ abd pain. Moderate after its onset 3 days ago. Significantly worsened since then. CT reveals uncomplicated diverticulitis. WBC 17.6k, Tm of 100.  Hospital Course:   ACute diverticulitis -Admitted with abdominal pain nausea vomiting, leukocytosis and fever -Clinically improved with bowel rest, IV antibiotics -Tolerating soft diet at the time of discharge, transition to oral Augmentin at discharge -Advised follow-up with gastroenterology in 1 month, patient reports having had a colonoscopy in the last 5 to 10 years  Type 2 diabetes mellitus -Resumed oral hypoglycemics  Hypertension -Continue lisinopril, HCTZ  dyslipidemia -continue Statins  Discharge Exam: Vitals:   08/03/18 0507 08/03/18 1347  BP: 136/75 127/71  Pulse: 79 75  Resp: 18 17  Temp: 99.4 F (37.4 C) 98.7 F (37.1 C)  SpO2: 94% 95%    General: AAOx3 Cardiovascular: S1S2/RRR Respiratory: CTAB  Discharge Instructions   Discharge Instructions    Discharge instructions   Complete by:  As directed    Soft diet for 4-5days   Increase activity slowly   Complete by:  As directed      Allergies as of 08/03/2018      Reactions    Bisphosphonates Other (See Comments)   Flu symptoms      Medication List    TAKE these medications   amoxicillin-clavulanate 875-125 MG tablet Commonly known as:  AUGMENTIN Take 1 tablet by mouth 2 (two) times daily for 6 days.   aspirin EC 81 MG tablet Take 81 mg by mouth daily.   atorvastatin 80 MG tablet Commonly known as:  LIPITOR Take 80 mg by mouth daily.   cholecalciferol 25 MCG (1000 UT) tablet Commonly known as:  VITAMIN D3 Take 1,000 Units by mouth daily.   ezetimibe 10 MG tablet Commonly known as:  ZETIA Take 10 mg by mouth daily.   levothyroxine 125 MCG tablet Commonly known as:  SYNTHROID Take 125 mcg by mouth daily before breakfast.   lisinopril-hydrochlorothiazide 20-12.5 MG tablet Commonly known as:  ZESTORETIC Take 1 tablet by mouth daily.   magnesium 30 MG tablet Take 30 mg by mouth daily.   meloxicam 7.5 MG tablet Commonly known as:  MOBIC Take 7.5 mg by mouth daily as needed for pain.   metroNIDAZOLE 0.75 % cream Commonly known as:  METROCREAM Apply 1 application topically 2 (two) times daily.   pantoprazole 40 MG tablet Commonly known as:  PROTONIX Take 40 mg by mouth daily.   senna 8.6 MG Tabs tablet Commonly known as:  SENOKOT Take 1 tablet (8.6 mg total) by mouth daily as needed for mild constipation.   Synjardy 12.08-998 MG Tabs Generic drug:  Empagliflozin-metFORMIN HCl Take 1 tablet by mouth daily.      Allergies  Allergen Reactions  . Bisphosphonates Other (  See Comments)    Flu symptoms      The results of significant diagnostics from this hospitalization (including imaging, microbiology, ancillary and laboratory) are listed below for reference.    Significant Diagnostic Studies: Ct Abdomen Pelvis W Contrast  Result Date: 08/01/2018 CLINICAL DATA:  63 year old female with abdominal pain and vomit EXAM: CT ABDOMEN AND PELVIS WITH CONTRAST TECHNIQUE: Multidetector CT imaging of the abdomen and pelvis was performed  using the standard protocol following bolus administration of intravenous contrast. CONTRAST:  135mL OMNIPAQUE IOHEXOL 300 MG/ML  SOLN COMPARISON:  None. FINDINGS: Lower chest: No acute finding Hepatobiliary: Unremarkable liver.  Unremarkable gallbladder Pancreas: Unremarkable Spleen: Unremarkable Adrenals/Urinary Tract: Unremarkable appearance of the adrenal glands. No evidence of hydronephrosis of the right or left kidney. No nephrolithiasis. Unremarkable course of the bilateral ureters. Unremarkable appearance of the urinary bladder. Stomach/Bowel: Unremarkable stomach. Hiatal hernia. Small bowel without distention or transition point. Normal appendix. Moderate stool burden of the proximal colon. Circumferential inflammatory changes in the proximal segment of sigmoid colon. This is in a segment of significant diverticular change. Inflammatory changes of the adjacent fat. Free fluid on the anti mesenteric border of the colon, with fluid layering dependently in the pelvis. No free air or abscess. Vascular/Lymphatic: Mild atherosclerotic changes. No aneurysm. Bilateral iliac arteries and proximal femoral arteries patent. Reproductive: Unremarkable uterus Other: None Musculoskeletal: No displaced fracture. Degenerative changes of L5-S1. IMPRESSION: Acute diverticulitis, uncomplicated. Hiatal hernia. Electronically Signed   By: Corrie Mckusick D.O.   On: 08/01/2018 21:45    Microbiology: No results found for this or any previous visit (from the past 240 hour(s)).   Labs: Basic Metabolic Panel: Recent Labs  Lab 08/01/18 2037 08/02/18 0514  NA 134* 134*  K 4.0 3.6  CL 97* 98  CO2 26 22  GLUCOSE 125* 160*  BUN 15 10  CREATININE 0.54 0.70  CALCIUM 8.8* 8.8*   Liver Function Tests: Recent Labs  Lab 08/01/18 2037  AST 19  ALT 33  ALKPHOS 79  BILITOT 1.2  PROT 8.1  ALBUMIN 4.3   Recent Labs  Lab 08/01/18 2037  LIPASE 30   No results for input(s): AMMONIA in the last 168 hours. CBC: Recent  Labs  Lab 08/01/18 2007 08/02/18 0514 08/03/18 0137  WBC 17.6* 18.1* 15.9*  HGB 14.8 13.7 14.0  HCT 46.6* 43.2 45.2  MCV 84.4 82.4 83.9  PLT 298 239 255   Cardiac Enzymes: No results for input(s): CKTOTAL, CKMB, CKMBINDEX, TROPONINI in the last 168 hours. BNP: BNP (last 3 results) No results for input(s): BNP in the last 8760 hours.  ProBNP (last 3 results) No results for input(s): PROBNP in the last 8760 hours.  CBG: Recent Labs  Lab 08/02/18 0759 08/02/18 1848 08/02/18 2102 08/03/18 0806 08/03/18 1205  GLUCAP 113* 102* 159* 117* 165*       Signed:  Domenic Polite MD.  [facility] 08/03/2018, 3:01 PM

## 2018-08-03 NOTE — Plan of Care (Signed)

## 2018-08-05 ENCOUNTER — Emergency Department (HOSPITAL_COMMUNITY): Payer: PRIVATE HEALTH INSURANCE

## 2018-08-05 ENCOUNTER — Other Ambulatory Visit: Payer: Self-pay

## 2018-08-05 ENCOUNTER — Inpatient Hospital Stay (HOSPITAL_COMMUNITY)
Admission: EM | Admit: 2018-08-05 | Discharge: 2018-08-15 | DRG: 392 | Disposition: A | Discharge status: Home / Self Care | Payer: PRIVATE HEALTH INSURANCE | Attending: Internal Medicine | Admitting: Internal Medicine

## 2018-08-05 ENCOUNTER — Encounter (HOSPITAL_COMMUNITY): Payer: Self-pay | Admitting: Emergency Medicine

## 2018-08-05 DIAGNOSIS — Z8249 Family history of ischemic heart disease and other diseases of the circulatory system: Secondary | ICD-10-CM

## 2018-08-05 DIAGNOSIS — Z791 Long term (current) use of non-steroidal anti-inflammatories (NSAID): Secondary | ICD-10-CM

## 2018-08-05 DIAGNOSIS — K630 Abscess of intestine: Secondary | ICD-10-CM | POA: Diagnosis not present

## 2018-08-05 DIAGNOSIS — E039 Hypothyroidism, unspecified: Secondary | ICD-10-CM | POA: Diagnosis present

## 2018-08-05 DIAGNOSIS — N179 Acute kidney failure, unspecified: Secondary | ICD-10-CM | POA: Diagnosis present

## 2018-08-05 DIAGNOSIS — Z87891 Personal history of nicotine dependence: Secondary | ICD-10-CM

## 2018-08-05 DIAGNOSIS — E785 Hyperlipidemia, unspecified: Secondary | ICD-10-CM | POA: Diagnosis present

## 2018-08-05 DIAGNOSIS — Z79890 Hormone replacement therapy (postmenopausal): Secondary | ICD-10-CM | POA: Diagnosis not present

## 2018-08-05 DIAGNOSIS — E119 Type 2 diabetes mellitus without complications: Secondary | ICD-10-CM

## 2018-08-05 DIAGNOSIS — Z888 Allergy status to other drugs, medicaments and biological substances status: Secondary | ICD-10-CM | POA: Diagnosis not present

## 2018-08-05 DIAGNOSIS — Z6841 Body Mass Index (BMI) 40.0 and over, adult: Secondary | ICD-10-CM | POA: Diagnosis not present

## 2018-08-05 DIAGNOSIS — E1140 Type 2 diabetes mellitus with diabetic neuropathy, unspecified: Secondary | ICD-10-CM

## 2018-08-05 DIAGNOSIS — Z7982 Long term (current) use of aspirin: Secondary | ICD-10-CM | POA: Diagnosis not present

## 2018-08-05 DIAGNOSIS — K5720 Diverticulitis of large intestine with perforation and abscess without bleeding: Secondary | ICD-10-CM

## 2018-08-05 DIAGNOSIS — Z833 Family history of diabetes mellitus: Secondary | ICD-10-CM | POA: Diagnosis not present

## 2018-08-05 DIAGNOSIS — K5740 Diverticulitis of both small and large intestine with perforation and abscess without bleeding: Secondary | ICD-10-CM

## 2018-08-05 DIAGNOSIS — I10 Essential (primary) hypertension: Secondary | ICD-10-CM | POA: Diagnosis present

## 2018-08-05 DIAGNOSIS — R1032 Left lower quadrant pain: Secondary | ICD-10-CM | POA: Diagnosis present

## 2018-08-05 HISTORY — DX: Diverticulitis of large intestine with perforation and abscess without bleeding: K57.20

## 2018-08-05 LAB — URINALYSIS, ROUTINE W REFLEX MICROSCOPIC
Bacteria, UA: NONE SEEN
Bilirubin Urine: NEGATIVE
Glucose, UA: >=500 mg/dL — AB
Hgb urine dipstick: NEGATIVE
Ketones, ur: 5 mg/dL — AB
Leukocytes,Ua: NEGATIVE
Nitrite: NEGATIVE
Protein, ur: NEGATIVE mg/dL
Specific Gravity, Urine: 1.028 (ref 1.005–1.030)
pH: 5 (ref 5.0–8.0)

## 2018-08-05 LAB — CBC WITH DIFFERENTIAL/PLATELET
Abs Immature Granulocytes: 0.06 10*3/uL (ref 0.00–0.07)
Basophils Absolute: 0 10*3/uL (ref 0.0–0.1)
Basophils Relative: 0 %
Eosinophils Absolute: 0.1 10*3/uL (ref 0.0–0.5)
Eosinophils Relative: 1 %
HCT: 49.5 % — ABNORMAL HIGH (ref 36.0–46.0)
Hemoglobin: 15.3 g/dL — ABNORMAL HIGH (ref 12.0–15.0)
Immature Granulocytes: 1 %
Lymphocytes Relative: 9 %
Lymphs Abs: 1 10*3/uL (ref 0.7–4.0)
MCH: 26.2 pg (ref 26.0–34.0)
MCHC: 30.9 g/dL (ref 30.0–36.0)
MCV: 84.8 fL (ref 80.0–100.0)
Monocytes Absolute: 0.6 10*3/uL (ref 0.1–1.0)
Monocytes Relative: 5 %
Neutro Abs: 9.7 10*3/uL — ABNORMAL HIGH (ref 1.7–7.7)
Neutrophils Relative %: 84 %
Platelets: 311 10*3/uL (ref 150–400)
RBC: 5.84 MIL/uL — ABNORMAL HIGH (ref 3.87–5.11)
RDW: 14.1 % (ref 11.5–15.5)
WBC: 11.5 10*3/uL — ABNORMAL HIGH (ref 4.0–10.5)
nRBC: 0 % (ref 0.0–0.2)

## 2018-08-05 LAB — BASIC METABOLIC PANEL
Anion gap: 15 (ref 5–15)
BUN: 10 mg/dL (ref 8–23)
CO2: 21 mmol/L — ABNORMAL LOW (ref 22–32)
Calcium: 9.2 mg/dL (ref 8.9–10.3)
Chloride: 97 mmol/L — ABNORMAL LOW (ref 98–111)
Creatinine, Ser: 0.69 mg/dL (ref 0.44–1.00)
GFR calc Af Amer: >60 mL/min (ref >60)
GFR calc non Af Amer: >60 mL/min (ref >60)
Glucose, Bld: 150 mg/dL — ABNORMAL HIGH (ref 70–99)
Potassium: 3.7 mmol/L (ref 3.5–5.1)
Sodium: 133 mmol/L — ABNORMAL LOW (ref 135–145)

## 2018-08-05 LAB — GLUCOSE, CAPILLARY
Glucose-Capillary: 149 mg/dL — ABNORMAL HIGH (ref 70–99)
Glucose-Capillary: 127 mg/dL — ABNORMAL HIGH (ref 70–99)
Glucose-Capillary: 102 mg/dL — ABNORMAL HIGH (ref 70–99)
Glucose-Capillary: 102 mg/dL — ABNORMAL HIGH (ref 70–99)
Glucose-Capillary: 100 mg/dL — ABNORMAL HIGH (ref 70–99)

## 2018-08-05 MED ORDER — ONDANSETRON HCL 4 MG/2ML IJ SOLN
4.0000 mg | Freq: Four times a day (QID) | INTRAMUSCULAR | Status: DC | PRN
  Administered 2018-08-07 – 2018-08-10 (×4): 4 mg via INTRAVENOUS
  Filled 2018-08-05 (×4): qty 2

## 2018-08-05 MED ORDER — SODIUM CHLORIDE 0.9 % IV SOLN
INTRAVENOUS | Status: DC
  Administered 2018-08-05 – 2018-08-14 (×10): via INTRAVENOUS

## 2018-08-05 MED ORDER — FENTANYL CITRATE (PF) 100 MCG/2ML IJ SOLN
50.0000 ug | Freq: Once | INTRAMUSCULAR | Status: AC
  Administered 2018-08-05: 50 ug via INTRAVENOUS
  Filled 2018-08-05: qty 2

## 2018-08-05 MED ORDER — METRONIDAZOLE 0.75 % EX CREA
1.0000 "application " | TOPICAL_CREAM | Freq: Two times a day (BID) | CUTANEOUS | Status: DC
  Filled 2018-08-05: qty 45

## 2018-08-05 MED ORDER — FENTANYL CITRATE (PF) 100 MCG/2ML IJ SOLN
100.0000 ug | Freq: Once | INTRAMUSCULAR | Status: AC
  Administered 2018-08-05: 100 ug via INTRAVENOUS
  Filled 2018-08-05: qty 2

## 2018-08-05 MED ORDER — ACETAMINOPHEN 650 MG RE SUPP: 650.0000 mg | Freq: Four times a day (QID) | RECTAL | Status: DC | PRN

## 2018-08-05 MED ORDER — MORPHINE SULFATE (PF) 2 MG/ML IV SOLN
2.0000 mg | INTRAVENOUS | Status: DC | PRN
  Administered 2018-08-05 – 2018-08-07 (×9): 2 mg via INTRAVENOUS
  Filled 2018-08-05 (×9): qty 1

## 2018-08-05 MED ORDER — ENOXAPARIN SODIUM 40 MG/0.4ML ~~LOC~~ SOLN
40.0000 mg | SUBCUTANEOUS | Status: DC
  Administered 2018-08-05 – 2018-08-10 (×6): 40 mg via SUBCUTANEOUS
  Filled 2018-08-05 (×7): qty 0.4

## 2018-08-05 MED ORDER — SODIUM CHLORIDE 0.9 % IV BOLUS
500.0000 mL | Freq: Once | INTRAVENOUS | Status: AC
  Administered 2018-08-05: 500 mL via INTRAVENOUS

## 2018-08-05 MED ORDER — PIPERACILLIN-TAZOBACTAM 3.375 G IVPB 30 MIN
3.3750 g | Freq: Once | INTRAVENOUS | Status: AC
  Administered 2018-08-05: 02:00:00 3.375 g via INTRAVENOUS
  Filled 2018-08-05: qty 50

## 2018-08-05 MED ORDER — EZETIMIBE 10 MG PO TABS
10.0000 mg | ORAL_TABLET | Freq: Every day | ORAL | Status: DC
  Administered 2018-08-05 – 2018-08-15 (×11): 10 mg via ORAL
  Filled 2018-08-05 (×11): qty 1

## 2018-08-05 MED ORDER — LEVOTHYROXINE SODIUM 125 MCG PO TABS
125.0000 ug | ORAL_TABLET | Freq: Every day | ORAL | Status: DC
  Administered 2018-08-06 – 2018-08-15 (×10): 125 ug via ORAL
  Filled 2018-08-05 (×12): qty 1

## 2018-08-05 MED ORDER — PANTOPRAZOLE SODIUM 40 MG PO TBEC
40.0000 mg | DELAYED_RELEASE_TABLET | Freq: Every day | ORAL | Status: DC
  Administered 2018-08-05 – 2018-08-15 (×11): 40 mg via ORAL
  Filled 2018-08-05 (×11): qty 1

## 2018-08-05 MED ORDER — ATORVASTATIN CALCIUM 80 MG PO TABS
80.0000 mg | ORAL_TABLET | Freq: Every day | ORAL | Status: DC
  Administered 2018-08-05 – 2018-08-15 (×11): 80 mg via ORAL
  Filled 2018-08-05 (×11): qty 1

## 2018-08-05 MED ORDER — IOHEXOL 300 MG/ML  SOLN
125.0000 mL | Freq: Once | INTRAMUSCULAR | Status: AC | PRN
  Administered 2018-08-05: 125 mL via INTRAVENOUS

## 2018-08-05 MED ORDER — PIPERACILLIN-TAZOBACTAM 3.375 G IVPB
3.3750 g | Freq: Three times a day (TID) | INTRAVENOUS | Status: DC
  Administered 2018-08-05 – 2018-08-07 (×7): 3.375 g via INTRAVENOUS
  Filled 2018-08-05 (×7): qty 50

## 2018-08-05 MED ORDER — ONDANSETRON HCL 4 MG/2ML IJ SOLN
4.0000 mg | Freq: Once | INTRAMUSCULAR | Status: AC
  Administered 2018-08-05: 01:00:00 4 mg via INTRAVENOUS
  Filled 2018-08-05: qty 2

## 2018-08-05 MED ORDER — INSULIN ASPART 100 UNIT/ML ~~LOC~~ SOLN
0.0000 [IU] | SUBCUTANEOUS | Status: DC
  Administered 2018-08-05 – 2018-08-08 (×3): 1 [IU] via SUBCUTANEOUS
  Administered 2018-08-09: 2 [IU] via SUBCUTANEOUS
  Administered 2018-08-09 – 2018-08-10 (×2): 1 [IU] via SUBCUTANEOUS

## 2018-08-05 MED ORDER — ACETAMINOPHEN 325 MG PO TABS
650.0000 mg | ORAL_TABLET | Freq: Four times a day (QID) | ORAL | Status: DC | PRN
  Administered 2018-08-05 – 2018-08-14 (×10): 650 mg via ORAL
  Filled 2018-08-05 (×10): qty 2

## 2018-08-05 MED ORDER — ONDANSETRON HCL 4 MG PO TABS: 4.0000 mg | ORAL_TABLET | Freq: Four times a day (QID) | ORAL | Status: DC | PRN

## 2018-08-05 NOTE — ED Notes (Signed)
ED TO INPATIENT HANDOFF REPORT  ED Nurse Name and Phone #: Suezanne Jacquet [phone]  S Name/Age/Gender Kristie Allen 63 y.o. female Room/Bed: 033C/033C  Code Status   Code Status: Full Code  Home/SNF/Other Home Patient oriented to: self, place, time and situation Is this baseline? Yes   Triage Complete: Triage complete  Chief Complaint severe abd pain and nausea  Triage Note Reports being discharged recently after having diverticulitis.  Reports pain has been intermittent today but worse around 2100.  Also c/o nausea but no vomiting.   Allergies Allergies  Allergen Reactions  . Bisphosphonates Other (See Comments)    Flu symptoms    Level of Care/Admitting Diagnosis ED Disposition    ED Disposition Condition Comment   Admit  Hospital Area: [facility] [facility] [[id number]]  Level of Care: Med-Surg [16]  Covid Evaluation: N/A  Diagnosis: Diverticulitis of large intestine with perforation [268341]  Admitting Physician: Doreatha Massed  Attending Physician: Etta Quill [phone]  Estimated length of stay: past midnight tomorrow  Certification:: I certify this patient will need inpatient services for at least 2 midnights  PT Class (Do Not Modify): Inpatient [101]  PT Acc Code (Do Not Modify): Private [1]       B Medical/Surgery History Past Medical History:  Diagnosis Date  . Diabetes mellitus without complication ([place])   . Diverticulitis 07/2018  . Hypertension   . Hypothyroidism    Past Surgical History:  Procedure Laterality Date  . VARICOSE VEIN SURGERY       A IV Location/Drains/Wounds Patient Lines/Drains/Airways Status   Active Line/Drains/Airways    Name:   Placement date:   Placement time:   Site:   Days:   Peripheral IV 08/01/18 Right Antecubital   08/01/18    2008    Antecubital   4   Peripheral IV 08/05/18 Left Forearm   08/05/18    0100    Forearm   less than 1          Intake/Output Last 24 hours  Intake/Output Summary (Last 24  hours) at 08/05/2018 0533 Last data filed at 08/05/2018 0243 Gross per 24 hour  Intake 550 ml  Output -  Net 550 ml    Labs/Imaging Results for orders placed or performed during the hospital encounter of 08/05/18 (from the past 48 hour(s))  CBC with Differential/Platelet     Status: Abnormal   Collection Time: 08/05/18  1:05 AM  Result Value Ref Range   WBC 11.5 (H) 4.0 - 10.5 K/uL   RBC 5.84 (H) 3.87 - 5.11 MIL/uL   Hemoglobin 15.3 (H) 12.0 - 15.0 g/dL   HCT 49.5 (H) 36.0 - 46.0 %   MCV 84.8 80.0 - 100.0 fL   MCH 26.2 26.0 - 34.0 pg   MCHC 30.9 30.0 - 36.0 g/dL   RDW 14.1 11.5 - 15.5 %   Platelets 311 150 - 400 K/uL   nRBC 0.0 0.0 - 0.2 %   Neutrophils Relative % 84 %   Neutro Abs 9.7 (H) 1.7 - 7.7 K/uL   Lymphocytes Relative 9 %   Lymphs Abs 1.0 0.7 - 4.0 K/uL   Monocytes Relative 5 %   Monocytes Absolute 0.6 0.1 - 1.0 K/uL   Eosinophils Relative 1 %   Eosinophils Absolute 0.1 0.0 - 0.5 K/uL   Basophils Relative 0 %   Basophils Absolute 0.0 0.0 - 0.1 K/uL   Immature Granulocytes 1 %   Abs Immature Granulocytes 0.06 0.00 - 0.07 K/uL  Comment: Performed at [facility], [place] [address] [place], [place] [address]  Basic metabolic panel     Status: Abnormal   Collection Time: 08/05/18  1:05 AM  Result Value Ref Range   Sodium 133 (L) 135 - 145 mmol/L   Potassium 3.7 3.5 - 5.1 mmol/L   Chloride 97 (L) 98 - 111 mmol/L   CO2 21 (L) 22 - 32 mmol/L   Glucose, Bld 150 (H) 70 - 99 mg/dL   BUN 10 8 - 23 mg/dL   Creatinine, Ser 0.69 0.44 - 1.00 mg/dL   Calcium 9.2 8.9 - 10.3 mg/dL   GFR calc non Af Amer >60 >60 mL/min   GFR calc Af Amer >60 >60 mL/min   Anion gap 15 5 - 15    Comment: Performed at [facility], [place] [address] [place], [place] [address]  Urinalysis, Routine w reflex microscopic     Status: Abnormal   Collection Time: 08/05/18  2:24 AM  Result Value Ref Range   Color, Urine YELLOW YELLOW   APPearance CLEAR CLEAR   Specific Gravity, Urine 1.028  1.005 - 1.030   pH 5.0 5.0 - 8.0   Glucose, UA >=500 (A) NEGATIVE mg/dL   Hgb urine dipstick NEGATIVE NEGATIVE   Bilirubin Urine NEGATIVE NEGATIVE   Ketones, ur 5 (A) NEGATIVE mg/dL   Protein, ur NEGATIVE NEGATIVE mg/dL   Nitrite NEGATIVE NEGATIVE   Leukocytes,Ua NEGATIVE NEGATIVE   RBC / HPF 0-5 0 - 5 RBC/hpf   WBC, UA 0-5 0 - 5 WBC/hpf   Bacteria, UA NONE SEEN NONE SEEN   Squamous Epithelial / LPF 0-5 0 - 5    Comment: Performed at [facility], [place] [address], [place], [place] [address]   Ct Abdomen Pelvis W Contrast  Result Date: 08/05/2018 CLINICAL DATA:  63 y/o F; lower abdominal pain with increasing severity of the right lower quadrant. EXAM: CT ABDOMEN AND PELVIS WITH CONTRAST TECHNIQUE: Multidetector CT imaging of the abdomen and pelvis was performed using the standard protocol following bolus administration of intravenous contrast. CONTRAST:  173mL OMNIPAQUE IOHEXOL 300 MG/ML  SOLN COMPARISON:  None. FINDINGS: Lower chest: Small hiatal hernia. Hepatobiliary: No focal liver abnormality is seen. No gallstones, gallbladder wall thickening, or biliary dilatation. Pancreas: Unremarkable. No pancreatic ductal dilatation or surrounding inflammatory changes. Spleen: Normal in size without focal abnormality. Adrenals/Urinary Tract: Adrenal glands are unremarkable. Kidneys are normal, without renal calculi, focal lesion, or hydronephrosis. Bladder is unremarkable. Stomach/Bowel: Acute perforated sigmoid diverticulitis. Trace volume of pneumoperitoneum. Small volume of free fluid within the pelvis and left lower quadrant as well as a 26 mm collection anterior to the psoas muscle at pelvic brim (series 3, image 62). No additional obstructive or inflammatory changes of the small or large bowel. Vascular/Lymphatic: Aortic atherosclerosis. No enlarged abdominal or pelvic lymph nodes. Reproductive: Uterus and bilateral adnexa are unremarkable. Other: No abdominal wall hernia or abnormality.  Musculoskeletal: No fracture is seen. IMPRESSION: 1. Acute perforated sigmoid diverticulitis. Trace pneumoperitoneum small volume free fluid within pelvis and left lower quadrant. 2. 26 mm discrete fluid collection anterior to the left psoas muscle at pelvic brim, probably a developing abscess. 3. Small hiatal hernia. 4. Aortic Atherosclerosis (ICD10-I70.0). Electronically Signed   By: Kristine Garbe M.D.   On: 08/05/2018 03:28    Pending Labs Unresulted Labs (From admission, onward)    Start     Ordered   08/06/18 0500  CBC  Tomorrow morning,  R     08/05/18 0525   08/06/18 8887  Basic metabolic panel  Tomorrow morning,   R     08/05/18 0525          Vitals/Pain Today's Vitals   08/05/18 0230 08/05/18 0345 08/05/18 0400 08/05/18 0415  BP: (!) 130/109 107/77 137/73 (!) 120/56  Pulse: 90 82 83 95  Resp:    16  Temp:      TempSrc:      SpO2: 99% 95% 94% 91%  Weight:      Height:      PainSc:        Isolation Precautions No active isolations  Medications Medications  piperacillin-tazobactam (ZOSYN) IVPB 3.375 g (has no administration in time range)  acetaminophen (TYLENOL) tablet 650 mg (has no administration in time range)    Or  acetaminophen (TYLENOL) suppository 650 mg (has no administration in time range)  ondansetron (ZOFRAN) tablet 4 mg (has no administration in time range)    Or  ondansetron (ZOFRAN) injection 4 mg (has no administration in time range)  enoxaparin (LOVENOX) injection 40 mg (has no administration in time range)  0.9 %  sodium chloride infusion (has no administration in time range)  morphine 2 MG/ML injection 2-4 mg (has no administration in time range)  insulin aspart (novoLOG) injection 0-9 Units (has no administration in time range)  ondansetron (ZOFRAN) injection 4 mg (4 mg Intravenous Given 08/05/18 0121)  sodium chloride 0.9 % bolus 500 mL (0 mLs Intravenous Stopped 08/05/18 0243)  fentaNYL (SUBLIMAZE) injection 50 mcg (50 mcg  Intravenous Given 08/05/18 0121)  piperacillin-tazobactam (ZOSYN) IVPB 3.375 g (0 g Intravenous Stopped 08/05/18 0211)  iohexol (OMNIPAQUE) 300 MG/ML solution 125 mL (125 mLs Intravenous Contrast Given 08/05/18 0245)  fentaNYL (SUBLIMAZE) injection 100 mcg (100 mcg Intravenous Given 08/05/18 0507)    Mobility walks Low fall risk   Focused Assessments GI   R Recommendations: See Admitting Provider Note  Report given to:   Additional Notes: N/A

## 2018-08-05 NOTE — Plan of Care (Signed)

## 2018-08-05 NOTE — ED Notes (Signed)
Taken to CT.

## 2018-08-05 NOTE — Consult Note (Signed)
Reason for Consult: Acute diverticulitis Referring Physician: Randal Buba MD  Kristie Allen is an 63 y.o. female.  HPI: Asked to see patient at the request of Dr. Nicholes Stairs due to abdominal pain.  The patient was admitted to the hospital on 08/01/2018 by general medicine for diverticulitis.  She was discharged home on 08/03/2018 on Augmentin for diverticulitis.  Yesterday she developed more abdominal pain with nausea and vomiting returns earlier this evening with progressive pain in her right lower quadrant.  CT scan was done which shows microperforation with some inflammatory stranding in the pelvis and questionable 2 cm abscess versus phlegmon.  She also had a fever to 101.3.  She is more comfortable now.  She does complain of lower abdominal pain which is bilateral.  It is made worse with movement coughing or straining.  Past Medical History:  Diagnosis Date  . Diabetes mellitus without complication ([place])   . Diverticulitis 07/2018  . Hypertension   . Hypothyroidism     Past Surgical History:  Procedure Laterality Date  . VARICOSE VEIN SURGERY      Family History  Problem Relation Age of Onset  . Anuerysm Mother   . Diabetes Mother   . Hypertension Mother   . Aneurysm Sister   . Diabetes Sister   . Aneurysm Brother   . Diabetes Brother   . Hypertension Brother   . Diabetes Father   . Heart attack Father   . Hypertension Father     Social History:  reports that she has quit smoking. She has never used smokeless tobacco. She reports that she does not drink alcohol or use drugs.  Allergies:  Allergies  Allergen Reactions  . Bisphosphonates Other (See Comments)    Flu symptoms    Medications: I have reviewed the patient's current medications.  Results for orders placed or performed during the hospital encounter of 08/05/18 (from the past 48 hour(s))  CBC with Differential/Platelet     Status: Abnormal   Collection Time: 08/05/18  1:05 AM  Result Value Ref Range   WBC 11.5 (H)  4.0 - 10.5 K/uL   RBC 5.84 (H) 3.87 - 5.11 MIL/uL   Hemoglobin 15.3 (H) 12.0 - 15.0 g/dL   HCT 49.5 (H) 36.0 - 46.0 %   MCV 84.8 80.0 - 100.0 fL   MCH 26.2 26.0 - 34.0 pg   MCHC 30.9 30.0 - 36.0 g/dL   RDW 14.1 11.5 - 15.5 %   Platelets 311 150 - 400 K/uL   nRBC 0.0 0.0 - 0.2 %   Neutrophils Relative % 84 %   Neutro Abs 9.7 (H) 1.7 - 7.7 K/uL   Lymphocytes Relative 9 %   Lymphs Abs 1.0 0.7 - 4.0 K/uL   Monocytes Relative 5 %   Monocytes Absolute 0.6 0.1 - 1.0 K/uL   Eosinophils Relative 1 %   Eosinophils Absolute 0.1 0.0 - 0.5 K/uL   Basophils Relative 0 %   Basophils Absolute 0.0 0.0 - 0.1 K/uL   Immature Granulocytes 1 %   Abs Immature Granulocytes 0.06 0.00 - 0.07 K/uL    Comment: Performed at [facility], [address] [place], [place] [address]  Basic metabolic panel     Status: Abnormal   Collection Time: 08/05/18  1:05 AM  Result Value Ref Range   Sodium 133 (L) 135 - 145 mmol/L   Potassium 3.7 3.5 - 5.1 mmol/L   Chloride 97 (L) 98 - 111 mmol/L   CO2 21 (L) 22 -  32 mmol/L   Glucose, Bld 150 (H) 70 - 99 mg/dL   BUN 10 8 - 23 mg/dL   Creatinine, Ser 0.69 0.44 - 1.00 mg/dL   Calcium 9.2 8.9 - 10.3 mg/dL   GFR calc non Af Amer >60 >60 mL/min   GFR calc Af Amer >60 >60 mL/min   Anion gap 15 5 - 15    Comment: Performed at [facility] [address], [place], [place] [address]  Urinalysis, Routine w reflex microscopic     Status: Abnormal   Collection Time: 08/05/18  2:24 AM  Result Value Ref Range   Color, Urine YELLOW YELLOW   APPearance CLEAR CLEAR   Specific Gravity, Urine 1.028 1.005 - 1.030   pH 5.0 5.0 - 8.0   Glucose, UA >=500 (A) NEGATIVE mg/dL   Hgb urine dipstick NEGATIVE NEGATIVE   Bilirubin Urine NEGATIVE NEGATIVE   Ketones, ur 5 (A) NEGATIVE mg/dL   Protein, ur NEGATIVE NEGATIVE mg/dL   Nitrite NEGATIVE NEGATIVE   Leukocytes,Ua NEGATIVE NEGATIVE   RBC / HPF 0-5 0 - 5 RBC/hpf   WBC, UA 0-5 0 - 5 WBC/hpf   Bacteria, UA NONE SEEN NONE  SEEN   Squamous Epithelial / LPF 0-5 0 - 5    Comment: Performed at [facility], [place] [address], [place], [place] [address]    Ct Abdomen Pelvis W Contrast  Result Date: 08/05/2018 CLINICAL DATA:  63 y/o F; lower abdominal pain with increasing severity of the right lower quadrant. EXAM: CT ABDOMEN AND PELVIS WITH CONTRAST TECHNIQUE: Multidetector CT imaging of the abdomen and pelvis was performed using the standard protocol following bolus administration of intravenous contrast. CONTRAST:  116mL OMNIPAQUE IOHEXOL 300 MG/ML  SOLN COMPARISON:  None. FINDINGS: Lower chest: Small hiatal hernia. Hepatobiliary: No focal liver abnormality is seen. No gallstones, gallbladder wall thickening, or biliary dilatation. Pancreas: Unremarkable. No pancreatic ductal dilatation or surrounding inflammatory changes. Spleen: Normal in size without focal abnormality. Adrenals/Urinary Tract: Adrenal glands are unremarkable. Kidneys are normal, without renal calculi, focal lesion, or hydronephrosis. Bladder is unremarkable. Stomach/Bowel: Acute perforated sigmoid diverticulitis. Trace volume of pneumoperitoneum. Small volume of free fluid within the pelvis and left lower quadrant as well as a 26 mm collection anterior to the psoas muscle at pelvic brim (series 3, image 62). No additional obstructive or inflammatory changes of the small or large bowel. Vascular/Lymphatic: Aortic atherosclerosis. No enlarged abdominal or pelvic lymph nodes. Reproductive: Uterus and bilateral adnexa are unremarkable. Other: No abdominal wall hernia or abnormality. Musculoskeletal: No fracture is seen. IMPRESSION: 1. Acute perforated sigmoid diverticulitis. Trace pneumoperitoneum small volume free fluid within pelvis and left lower quadrant. 2. 26 mm discrete fluid collection anterior to the left psoas muscle at pelvic brim, probably a developing abscess. 3. Small hiatal hernia. 4. Aortic Atherosclerosis (ICD10-I70.0). Electronically Signed    By: Kristine Garbe M.D.   On: 08/05/2018 03:28    Review of Systems  Constitutional: Positive for fever.  HENT: Negative.   Gastrointestinal: Positive for abdominal pain.  Skin: Negative.   All other systems reviewed and are negative.  Blood pressure 107/77, pulse 82, temperature (!) 101.2 F (38.4 C), temperature source Oral, resp. rate 16, height 5\' 5"  (1.651 m), weight 117.7 kg, SpO2 95 %. Physical Exam  Constitutional: She appears well-developed and well-nourished. She appears distressed.  HENT:  Head: Normocephalic.  Eyes: Pupils are equal, round, and reactive to light.  Neck: Normal range of motion. Neck supple.  Cardiovascular:  Normal rate.  Respiratory: Effort normal and breath sounds normal.  GI: There is abdominal tenderness in the right lower quadrant and left lower quadrant. There is no rigidity, no rebound and no guarding.  Musculoskeletal: Normal range of motion.  Neurological: She is alert.  Skin: Skin is warm and dry.  Psychiatric: She has a normal mood and affect. Her behavior is normal.    Assessment/Plan: Progressive complex diverticulitis with microperforation and potential small pelvic abscess  No acute surgical need at this point.  Recommend IV Zosyn and readmission to the medical service for management.  She may require a longer course of IV antibiotics.  This is her first attack she states.  Would keep n.p.o. for now.  Surgery service will follow  Joyice Faster Khadar Monger 08/05/2018, 3:59 AM

## 2018-08-05 NOTE — Progress Notes (Signed)
Patient ID: Kristie Allen, female   DOB: 01/16/1956, 63 y.o.   MRN: [id number] [facility] Surgery Progress Note:   * No surgery found *  Subjective: Mental status is alert;  Pain better Objective: Vital signs in last 24 hours: Temp:  [101.2 F (38.4 C)-102.9 F (39.4 C)] 102.9 F (39.4 C) (04/19 0608) Pulse Rate:  [77-95] 87 (04/19 0608) Resp:  [16-18] 18 (04/19 0608) BP: (98-151)/(56-109) 98/85 (04/19 0608) SpO2:  [91 %-99 %] 94 % (04/19 4854) Weight:  [117.7 kg] 117.7 kg (04/19 0057)  Intake/Output from previous day: 04/18 0701 - 04/19 0700 In: 550 [IV Piggyback:550] Out: -  Intake/Output this shift: No intake/output data recorded.  Physical Exam: Work of breathing is not labored.  Pain is better in abdomen.  Lab Results:  Results for orders placed or performed during the hospital encounter of 08/05/18 (from the past 48 hour(s))  CBC with Differential/Platelet     Status: Abnormal   Collection Time: 08/05/18  1:05 AM  Result Value Ref Range   WBC 11.5 (H) 4.0 - 10.5 K/uL   RBC 5.84 (H) 3.87 - 5.11 MIL/uL   Hemoglobin 15.3 (H) 12.0 - 15.0 g/dL   HCT 49.5 (H) 36.0 - 46.0 %   MCV 84.8 80.0 - 100.0 fL   MCH 26.2 26.0 - 34.0 pg   MCHC 30.9 30.0 - 36.0 g/dL   RDW 14.1 11.5 - 15.5 %   Platelets 311 150 - 400 K/uL   nRBC 0.0 0.0 - 0.2 %   Neutrophils Relative % 84 %   Neutro Abs 9.7 (H) 1.7 - 7.7 K/uL   Lymphocytes Relative 9 %   Lymphs Abs 1.0 0.7 - 4.0 K/uL   Monocytes Relative 5 %   Monocytes Absolute 0.6 0.1 - 1.0 K/uL   Eosinophils Relative 1 %   Eosinophils Absolute 0.1 0.0 - 0.5 K/uL   Basophils Relative 0 %   Basophils Absolute 0.0 0.0 - 0.1 K/uL   Immature Granulocytes 1 %   Abs Immature Granulocytes 0.06 0.00 - 0.07 K/uL    Comment: Performed at [facility], [address], [place], [place] [address]  Basic metabolic panel     Status: Abnormal   Collection Time: 08/05/18  1:05 AM  Result Value Ref Range   Sodium 133 (L) 135 - 145 mmol/L   Potassium 3.7 3.5 - 5.1 mmol/L   Chloride 97 (L) 98 - 111 mmol/L   CO2 21 (L) 22 - 32 mmol/L   Glucose, Bld 150 (H) 70 - 99 mg/dL   BUN 10 8 - 23 mg/dL   Creatinine, Ser 0.69 0.44 - 1.00 mg/dL   Calcium 9.2 8.9 - 10.3 mg/dL   GFR calc non Af Amer >60 >60 mL/min   GFR calc Af Amer >60 >60 mL/min   Anion gap 15 5 - 15    Comment: Performed at [facility], [place] [address], [place], [place] [address]  Urinalysis, Routine w reflex microscopic     Status: Abnormal   Collection Time: 08/05/18  2:24 AM  Result Value Ref Range   Color, Urine YELLOW YELLOW   APPearance CLEAR CLEAR   Specific Gravity, Urine 1.028 1.005 - 1.030   pH 5.0 5.0 - 8.0   Glucose, UA >=500 (A) NEGATIVE mg/dL   Hgb urine dipstick NEGATIVE NEGATIVE   Bilirubin Urine NEGATIVE NEGATIVE   Ketones, ur 5 (A) NEGATIVE mg/dL   Protein, ur NEGATIVE NEGATIVE mg/dL   Nitrite NEGATIVE NEGATIVE  Leukocytes,Ua NEGATIVE NEGATIVE   RBC / HPF 0-5 0 - 5 RBC/hpf   WBC, UA 0-5 0 - 5 WBC/hpf   Bacteria, UA NONE SEEN NONE SEEN   Squamous Epithelial / LPF 0-5 0 - 5    Comment: Performed at [facility], [place] [address], [place], [place] [address]  Glucose, capillary     Status: Abnormal   Collection Time: 08/05/18  6:38 AM  Result Value Ref Range   Glucose-Capillary 149 (H) 70 - 99 mg/dL  Glucose, capillary     Status: Abnormal   Collection Time: 08/05/18  8:16 AM  Result Value Ref Range   Glucose-Capillary 127 (H) 70 - 99 mg/dL    Radiology/Results: Ct Abdomen Pelvis W Contrast  Result Date: 08/05/2018 CLINICAL DATA:  63 y/o F; lower abdominal pain with increasing severity of the right lower quadrant. EXAM: CT ABDOMEN AND PELVIS WITH CONTRAST TECHNIQUE: Multidetector CT imaging of the abdomen and pelvis was performed using the standard protocol following bolus administration of intravenous contrast. CONTRAST:  119mL OMNIPAQUE IOHEXOL 300 MG/ML  SOLN COMPARISON:  None. FINDINGS: Lower chest: Small hiatal hernia.  Hepatobiliary: No focal liver abnormality is seen. No gallstones, gallbladder wall thickening, or biliary dilatation. Pancreas: Unremarkable. No pancreatic ductal dilatation or surrounding inflammatory changes. Spleen: Normal in size without focal abnormality. Adrenals/Urinary Tract: Adrenal glands are unremarkable. Kidneys are normal, without renal calculi, focal lesion, or hydronephrosis. Bladder is unremarkable. Stomach/Bowel: Acute perforated sigmoid diverticulitis. Trace volume of pneumoperitoneum. Small volume of free fluid within the pelvis and left lower quadrant as well as a 26 mm collection anterior to the psoas muscle at pelvic brim (series 3, image 62). No additional obstructive or inflammatory changes of the small or large bowel. Vascular/Lymphatic: Aortic atherosclerosis. No enlarged abdominal or pelvic lymph nodes. Reproductive: Uterus and bilateral adnexa are unremarkable. Other: No abdominal wall hernia or abnormality. Musculoskeletal: No fracture is seen. IMPRESSION: 1. Acute perforated sigmoid diverticulitis. Trace pneumoperitoneum small volume free fluid within pelvis and left lower quadrant. 2. 26 mm discrete fluid collection anterior to the left psoas muscle at pelvic brim, probably a developing abscess. 3. Small hiatal hernia. 4. Aortic Atherosclerosis (ICD10-I70.0). Electronically Signed   By: Kristine Garbe M.D.   On: 08/05/2018 03:28    Anti-infectives: Anti-infectives (From admission, onward)   Start     Dose/Rate Route Frequency Ordered Stop   08/05/18 0800  piperacillin-tazobactam (ZOSYN) IVPB 3.375 g     3.375 g 12.5 mL/hr over 240 Minutes Intravenous Every 8 hours 08/05/18 0508     08/05/18 0130  piperacillin-tazobactam (ZOSYN) IVPB 3.375 g     3.375 g 100 mL/hr over 30 Minutes Intravenous  Once 08/05/18 0123 08/05/18 0211      Assessment/Plan: Problem List: Patient Active Problem List   Diagnosis Date Noted  . Diverticulitis of large intestine with  perforation 08/05/2018  . DM2 (diabetes mellitus, type 2) ([place]) 08/02/2018  . HLD (hyperlipidemia) 08/02/2018  . HTN (hypertension) 08/02/2018  . Diverticulitis 08/02/2018  . Acute diverticulitis 08/01/2018    Recurrent flair of diverticulitis.  Agree with bowel rest and antibiotics with repeat CT scan.   * No surgery found *    LOS: 0 days   Matt B. Hassell Done, MD, [facility] [phone] beeper [phone]  08/05/2018 8:37 AM

## 2018-08-05 NOTE — ED Notes (Signed)
Attempted report 

## 2018-08-05 NOTE — ED Provider Notes (Signed)
[facility] EMERGENCY DEPARTMENT Provider Note   CSN: [id number] Arrival date & time: 08/05/18  0046    History   Chief Complaint Chief Complaint  Patient presents with  . Abdominal Pain    HPI Kristie Allen is a 63 y.o. female.     The history is provided by the patient.  Abdominal Pain  Pain location:  LLQ and RLQ Pain radiates to:  Does not radiate Pain severity:  Severe Onset quality:  Gradual Timing:  Intermittent Progression:  Worsening Chronicity:  New Context: not trauma   Relieved by:  Nothing Worsened by:  Nothing Ineffective treatments: tylenol and Augmentin  Associated symptoms: fever and nausea   Associated symptoms: no chest pain, no constipation, no cough, no diarrhea and no shortness of breath   Risk factors: obesity and recent hospitalization   Patient was admitted for diverticulitis and discharged on 4/17.  Has been home and then this evening developed worsening pain nausea and dry heaves.  Has fever as well and feels bloated.    Past Medical History:  Diagnosis Date  . Diabetes mellitus without complication ([place])   . Diverticulitis 07/2018  . Hypertension   . Hypothyroidism     Patient Active Problem List   Diagnosis Date Noted  . DM2 (diabetes mellitus, type 2) ([place]) 08/02/2018  . HLD (hyperlipidemia) 08/02/2018  . HTN (hypertension) 08/02/2018  . Diverticulitis 08/02/2018  . Acute diverticulitis 08/01/2018    Past Surgical History:  Procedure Laterality Date  . VARICOSE VEIN SURGERY       OB History   No obstetric history on file.      Home Medications    Prior to Admission medications   Medication Sig Start Date End Date Taking? Authorizing Provider  amoxicillin-clavulanate (AUGMENTIN) 875-125 MG tablet Take 1 tablet by mouth 2 (two) times daily for 6 days. 08/03/18 08/09/18  Domenic Polite, MD  aspirin EC 81 MG tablet Take 81 mg by mouth daily.    [provider]  atorvastatin (LIPITOR) 80 MG  tablet Take 80 mg by mouth daily. 07/30/18   [provider]  cholecalciferol (VITAMIN D3) 25 MCG (1000 UT) tablet Take 1,000 Units by mouth daily.    [provider]  ezetimibe (ZETIA) 10 MG tablet Take 10 mg by mouth daily. 06/13/18   [provider]  levothyroxine (SYNTHROID, LEVOTHROID) 125 MCG tablet Take 125 mcg by mouth daily before breakfast. 04/23/18   [provider]  lisinopril-hydrochlorothiazide (PRINZIDE,ZESTORETIC) 20-12.5 MG tablet Take 1 tablet by mouth daily. 07/30/18   [provider]  magnesium 30 MG tablet Take 30 mg by mouth daily.    [provider]  meloxicam (MOBIC) 7.5 MG tablet Take 7.5 mg by mouth daily as needed for pain.  05/14/18   [provider]  metroNIDAZOLE (METROCREAM) 0.75 % cream Apply 1 application topically 2 (two) times daily. 07/16/18   [provider]  pantoprazole (PROTONIX) 40 MG tablet Take 40 mg by mouth daily. 04/24/18   [provider]  senna (SENOKOT) 8.6 MG TABS tablet Take 1 tablet (8.6 mg total) by mouth daily as needed for mild constipation. 08/03/18   Domenic Polite, MD  SYNJARDY 12.08-998 MG TABS Take 1 tablet by mouth daily. 07/30/18   [provider]    Family History Family History  Problem Relation Age of Onset  . Anuerysm Mother   . Diabetes Mother   . Hypertension Mother   . Aneurysm Sister   . Diabetes Sister   .  Aneurysm Brother   . Diabetes Brother   . Hypertension Brother   . Diabetes Father   . Heart attack Father   . Hypertension Father     Social History Social History   Tobacco Use  . Smoking status: Former Research scientist (life sciences)  . Smokeless tobacco: Never Used  . Tobacco comment: " back in the 80"s"  Substance Use Topics  . Alcohol use: Never    Frequency: Never  . Drug use: Never     Allergies   Bisphosphonates   Review of Systems Review of Systems  Constitutional: Positive for fever.  Respiratory: Negative for cough and shortness  of breath.   Cardiovascular: Negative for chest pain, palpitations and leg swelling.  Gastrointestinal: Positive for abdominal pain and nausea. Negative for constipation and diarrhea.  All other systems reviewed and are negative.    Physical Exam Updated Vital Signs BP (!) 151/67 (BP Location: Right Arm)   Pulse 83   Temp (!) 101.2 F (38.4 C) (Oral)   Resp 18   Ht 5\' 5"  (1.651 m)   Wt 117.7 kg   SpO2 99%   BMI 43.18 kg/m   Physical Exam Vitals signs and nursing note reviewed.  Constitutional:      General: She is not in acute distress.    Appearance: She is obese.  HENT:     Head: Normocephalic and atraumatic.     Nose: Nose normal.  Eyes:     Extraocular Movements: Extraocular movements intact.     Conjunctiva/sclera: Conjunctivae normal.  Neck:     Musculoskeletal: Normal range of motion and neck supple.  Cardiovascular:     Rate and Rhythm: Normal rate and regular rhythm.     Pulses: Normal pulses.     Heart sounds: Normal heart sounds.  Pulmonary:     Effort: Pulmonary effort is normal. No respiratory distress.     Breath sounds: Normal breath sounds. No wheezing or rales.  Abdominal:     General: Bowel sounds are normal. There is distension.     Tenderness: There is no abdominal tenderness. There is no guarding or rebound.     Hernia: No hernia is present.  Musculoskeletal: Normal range of motion.  Skin:    General: Skin is warm and dry.     Capillary Refill: Capillary refill takes less than 2 seconds.  Neurological:     General: No focal deficit present.     Mental Status: She is alert and oriented to person, place, and time.  Psychiatric:        Mood and Affect: Mood normal.        Behavior: Behavior normal.      ED Treatments / Results  Labs (all labs ordered are listed, but only abnormal results are displayed) Results for orders placed or performed during the hospital encounter of 08/05/18  CBC with Differential/Platelet  Result Value Ref Range    WBC 11.5 (H) 4.0 - 10.5 K/uL   RBC 5.84 (H) 3.87 - 5.11 MIL/uL   Hemoglobin 15.3 (H) 12.0 - 15.0 g/dL   HCT 49.5 (H) 36.0 - 46.0 %   MCV 84.8 80.0 - 100.0 fL   MCH 26.2 26.0 - 34.0 pg   MCHC 30.9 30.0 - 36.0 g/dL   RDW 14.1 11.5 - 15.5 %   Platelets 311 150 - 400 K/uL   nRBC 0.0 0.0 - 0.2 %   Neutrophils Relative % 84 %   Neutro Abs 9.7 (H) 1.7 - 7.7 K/uL  Lymphocytes Relative 9 %   Lymphs Abs 1.0 0.7 - 4.0 K/uL   Monocytes Relative 5 %   Monocytes Absolute 0.6 0.1 - 1.0 K/uL   Eosinophils Relative 1 %   Eosinophils Absolute 0.1 0.0 - 0.5 K/uL   Basophils Relative 0 %   Basophils Absolute 0.0 0.0 - 0.1 K/uL   Immature Granulocytes 1 %   Abs Immature Granulocytes 0.06 0.00 - 0.07 K/uL  Basic metabolic panel  Result Value Ref Range   Sodium 133 (L) 135 - 145 mmol/L   Potassium 3.7 3.5 - 5.1 mmol/L   Chloride 97 (L) 98 - 111 mmol/L   CO2 21 (L) 22 - 32 mmol/L   Glucose, Bld 150 (H) 70 - 99 mg/dL   BUN 10 8 - 23 mg/dL   Creatinine, Ser 0.69 0.44 - 1.00 mg/dL   Calcium 9.2 8.9 - 10.3 mg/dL   GFR calc non Af Amer >60 >60 mL/min   GFR calc Af Amer >60 >60 mL/min   Anion gap 15 5 - 15  Urinalysis, Routine w reflex microscopic  Result Value Ref Range   Color, Urine YELLOW YELLOW   APPearance CLEAR CLEAR   Specific Gravity, Urine 1.028 1.005 - 1.030   pH 5.0 5.0 - 8.0   Glucose, UA >=500 (A) NEGATIVE mg/dL   Hgb urine dipstick NEGATIVE NEGATIVE   Bilirubin Urine NEGATIVE NEGATIVE   Ketones, ur 5 (A) NEGATIVE mg/dL   Protein, ur NEGATIVE NEGATIVE mg/dL   Nitrite NEGATIVE NEGATIVE   Leukocytes,Ua NEGATIVE NEGATIVE   RBC / HPF 0-5 0 - 5 RBC/hpf   WBC, UA 0-5 0 - 5 WBC/hpf   Bacteria, UA NONE SEEN NONE SEEN   Squamous Epithelial / LPF 0-5 0 - 5   Ct Abdomen Pelvis W Contrast  Result Date: 08/05/2018 CLINICAL DATA:  63 y/o F; lower abdominal pain with increasing severity of the right lower quadrant. EXAM: CT ABDOMEN AND PELVIS WITH CONTRAST TECHNIQUE: Multidetector CT  imaging of the abdomen and pelvis was performed using the standard protocol following bolus administration of intravenous contrast. CONTRAST:  144mL OMNIPAQUE IOHEXOL 300 MG/ML  SOLN COMPARISON:  None. FINDINGS: Lower chest: Small hiatal hernia. Hepatobiliary: No focal liver abnormality is seen. No gallstones, gallbladder wall thickening, or biliary dilatation. Pancreas: Unremarkable. No pancreatic ductal dilatation or surrounding inflammatory changes. Spleen: Normal in size without focal abnormality. Adrenals/Urinary Tract: Adrenal glands are unremarkable. Kidneys are normal, without renal calculi, focal lesion, or hydronephrosis. Bladder is unremarkable. Stomach/Bowel: Acute perforated sigmoid diverticulitis. Trace volume of pneumoperitoneum. Small volume of free fluid within the pelvis and left lower quadrant as well as a 26 mm collection anterior to the psoas muscle at pelvic brim (series 3, image 62). No additional obstructive or inflammatory changes of the small or large bowel. Vascular/Lymphatic: Aortic atherosclerosis. No enlarged abdominal or pelvic lymph nodes. Reproductive: Uterus and bilateral adnexa are unremarkable. Other: No abdominal wall hernia or abnormality. Musculoskeletal: No fracture is seen. IMPRESSION: 1. Acute perforated sigmoid diverticulitis. Trace pneumoperitoneum small volume free fluid within pelvis and left lower quadrant. 2. 26 mm discrete fluid collection anterior to the left psoas muscle at pelvic brim, probably a developing abscess. 3. Small hiatal hernia. 4. Aortic Atherosclerosis (ICD10-I70.0). Electronically Signed   By: Kristine Garbe M.D.   On: 08/05/2018 03:28   Ct Abdomen Pelvis W Contrast  Result Date: 08/01/2018 CLINICAL DATA:  63 year old female with abdominal pain and vomit EXAM: CT ABDOMEN AND PELVIS WITH CONTRAST TECHNIQUE: Multidetector CT imaging of  the abdomen and pelvis was performed using the standard protocol following bolus administration of  intravenous contrast. CONTRAST:  111mL OMNIPAQUE IOHEXOL 300 MG/ML  SOLN COMPARISON:  None. FINDINGS: Lower chest: No acute finding Hepatobiliary: Unremarkable liver.  Unremarkable gallbladder Pancreas: Unremarkable Spleen: Unremarkable Adrenals/Urinary Tract: Unremarkable appearance of the adrenal glands. No evidence of hydronephrosis of the right or left kidney. No nephrolithiasis. Unremarkable course of the bilateral ureters. Unremarkable appearance of the urinary bladder. Stomach/Bowel: Unremarkable stomach. Hiatal hernia. Small bowel without distention or transition point. Normal appendix. Moderate stool burden of the proximal colon. Circumferential inflammatory changes in the proximal segment of sigmoid colon. This is in a segment of significant diverticular change. Inflammatory changes of the adjacent fat. Free fluid on the anti mesenteric border of the colon, with fluid layering dependently in the pelvis. No free air or abscess. Vascular/Lymphatic: Mild atherosclerotic changes. No aneurysm. Bilateral iliac arteries and proximal femoral arteries patent. Reproductive: Unremarkable uterus Other: None Musculoskeletal: No displaced fracture. Degenerative changes of L5-S1. IMPRESSION: Acute diverticulitis, uncomplicated. Hiatal hernia. Electronically Signed   By: Corrie Mckusick D.O.   On: 08/01/2018 21:45    Procedures Procedures (including critical care time)  Medications Ordered in ED Medications  acetaminophen (TYLENOL) suppository 650 mg (has no administration in time range)  piperacillin-tazobactam (ZOSYN) IVPB 3.375 g (has no administration in time range)  ondansetron (ZOFRAN) injection 4 mg (4 mg Intravenous Given 08/05/18 0121)  sodium chloride 0.9 % bolus 500 mL (500 mLs Intravenous New Bag/Given 08/05/18 0125)  fentaNYL (SUBLIMAZE) injection 50 mcg (50 mcg Intravenous Given 08/05/18 0121)     340 case d/w Dr. Brantley Stage of surgery, patient does not need surgical intervention at this time.   Please admit to medicine  Final Clinical Impressions(s) / ED Diagnoses   Will admit to medicine   Lilyanah Celestin, MD 08/05/18 1448

## 2018-08-05 NOTE — Progress Notes (Signed)
PROGRESS NOTE   Kristie Allen  EPP:[id number]    DOB: 1955-09-13    DOA: 08/05/2018  PCP: [name]., MD   I have briefly reviewed patients previous medical records in [place].  Brief Narrative:  63 year old female with PMH of DM 2, HTN, HLD, hypothyroid, recent hospitalization 6/06-3/01 for uncomplicated diverticulitis, did well with conservative management and discharged home, returned with worsening LLQ abdominal pain, fever, chills and dry heaves.  Admitted for acute perforated sigmoid diverticulitis with trace pneumoperitoneum and possible developing pelvic abscess.  Managing conservatively.  General surgery on board.   Assessment & Plan:   Principal Problem:   Diverticulitis of large intestine with perforation Active Problems:   DM2 (diabetes mellitus, type 2) ([place])   HLD (hyperlipidemia)   HTN (hypertension)   1. Acute perforated sigmoid diverticulitis with trace pneumoperitoneum and possibly developing pelvic abscess: General surgery consulted.  Managing conservatively with NPO, IV fluids, pain management and IV Zosyn.  Patient states that she has had a colonoscopy within the last 10 years by GI in [facility] and may be due again at this time.  Recommend colonoscopy after acute diverticulitis is completely resolved. 2. Type II DM: Hold oral hypoglycemics.  Continue SSI.  Reasonable inpatient control. 3. Essential hypertension: Controlled.  Holding HCTZ and lisinopril at this time. 4. Hyperlipidemia: Continue statins and Zetia. 5. Hypothyroid: Continue Synthroid. 6. Morbid obesity/Body mass index is 43.18 kg/m.    DVT prophylaxis: Lovenox Code Status: Full Family Communication: None at bedside Disposition: DC home pending clinical improvement   Consultants:  General surgery  Procedures:  None  Antimicrobials:  IV Zosyn   Subjective: Patient reports that she stays with her daughter, son-in-law and grandkids.  Independent of activities.  Last BM 4/17.   Abdominal pain better/controlled with medications, 5/10 in left lower quadrant.  No vomiting.  Spiked high fever early this morning.  ROS: As above, otherwise negative.  Objective:  Vitals:   08/05/18 0415 08/05/18 0608 08/05/18 1022 08/05/18 1411  BP: (!) 120/56 98/85 119/67 (!) 148/80  Pulse: 95 87 75 85  Resp: 16 18 18 18   Temp:  (!) 102.9 F (39.4 C) 99 F (37.2 C) 99.2 F (37.3 C)  TempSrc:  Oral Oral Oral  SpO2: 91% 94% 97% 95%  Weight:      Height:        Examination:  General exam: Pleasant young female, moderately built and obese lying comfortably supine in bed.  Oral mucosa with borderline hydration. Respiratory system: Clear to auscultation. Respiratory effort normal. Cardiovascular system: S1 & S2 heard, RRR. No JVD, murmurs, rubs, gallops or clicks. No pedal edema. Gastrointestinal system: Abdomen is nondistended, soft and LLQ tenderness with mild guarding but no rigidity or rebound. No organomegaly or masses felt. Normal bowel sounds heard. Central nervous system: Alert and oriented. No focal neurological deficits. Extremities: Symmetric 5 x 5 power. Skin: No rashes, lesions or ulcers Psychiatry: Judgement and insight appear normal. Mood & affect appropriate.     Data Reviewed: I have personally reviewed following labs and imaging studies  CBC: Recent Labs  Lab 08/01/18 2007 08/02/18 0514 08/03/18 0137 08/05/18 0105  WBC 17.6* 18.1* 15.9* 11.5*  NEUTROABS  --   --   --  9.7*  HGB 14.8 13.7 14.0 15.3*  HCT 46.6* 43.2 45.2 49.5*  MCV 84.4 82.4 83.9 84.8  PLT 298 239 255 601   Basic Metabolic Panel: Recent Labs  Lab 08/01/18 2037 08/02/18 0514 08/05/18 0105  NA  134* 134* 133*  K 4.0 3.6 3.7  CL 97* 98 97*  CO2 26 22 21*  GLUCOSE 125* 160* 150*  BUN 15 10 10   CREATININE 0.54 0.70 0.69  CALCIUM 8.8* 8.8* 9.2   Liver Function Tests: Recent Labs  Lab 08/01/18 2037  AST 19  ALT 33  ALKPHOS 79  BILITOT 1.2  PROT 8.1  ALBUMIN 4.3    CBG: Recent Labs  Lab 08/03/18 0806 08/03/18 1205 08/05/18 0638 08/05/18 0816 08/05/18 1205  GLUCAP 117* 165* 149* 127* 102*    No results found for this or any previous visit (from the past 240 hour(s)).       Radiology Studies: Ct Abdomen Pelvis W Contrast  Result Date: 08/05/2018 CLINICAL DATA:  63 y/o F; lower abdominal pain with increasing severity of the right lower quadrant. EXAM: CT ABDOMEN AND PELVIS WITH CONTRAST TECHNIQUE: Multidetector CT imaging of the abdomen and pelvis was performed using the standard protocol following bolus administration of intravenous contrast. CONTRAST:  137mL OMNIPAQUE IOHEXOL 300 MG/ML  SOLN COMPARISON:  None. FINDINGS: Lower chest: Small hiatal hernia. Hepatobiliary: No focal liver abnormality is seen. No gallstones, gallbladder wall thickening, or biliary dilatation. Pancreas: Unremarkable. No pancreatic ductal dilatation or surrounding inflammatory changes. Spleen: Normal in size without focal abnormality. Adrenals/Urinary Tract: Adrenal glands are unremarkable. Kidneys are normal, without renal calculi, focal lesion, or hydronephrosis. Bladder is unremarkable. Stomach/Bowel: Acute perforated sigmoid diverticulitis. Trace volume of pneumoperitoneum. Small volume of free fluid within the pelvis and left lower quadrant as well as a 26 mm collection anterior to the psoas muscle at pelvic brim (series 3, image 62). No additional obstructive or inflammatory changes of the small or large bowel. Vascular/Lymphatic: Aortic atherosclerosis. No enlarged abdominal or pelvic lymph nodes. Reproductive: Uterus and bilateral adnexa are unremarkable. Other: No abdominal wall hernia or abnormality. Musculoskeletal: No fracture is seen. IMPRESSION: 1. Acute perforated sigmoid diverticulitis. Trace pneumoperitoneum small volume free fluid within pelvis and left lower quadrant. 2. 26 mm discrete fluid collection anterior to the left psoas muscle at pelvic brim, probably  a developing abscess. 3. Small hiatal hernia. 4. Aortic Atherosclerosis (ICD10-I70.0). Electronically Signed   By: Kristine Garbe M.D.   On: 08/05/2018 03:28        Scheduled Meds:  atorvastatin  80 mg Oral Daily   enoxaparin (LOVENOX) injection  40 mg Subcutaneous Q24H   ezetimibe  10 mg Oral Daily   insulin aspart  0-9 Units Subcutaneous Q4H   levothyroxine  125 mcg Oral Q0600   pantoprazole  40 mg Oral Daily   Continuous Infusions:  sodium chloride 125 mL/hr at 08/05/18 0646   piperacillin-tazobactam (ZOSYN)  IV 3.375 g (08/05/18 1505)     LOS: 0 days     Vernell Leep, MD, FACP, [facility]. [facility]  To contact the attending provider between 7A-7P or the covering provider during after hours 7P-7A, please log into the web site [email or web address] and access using universal [place] password for that web site. If you do not have the password, please call the hospital operator.  08/05/2018, 5:54 PM

## 2018-08-05 NOTE — Progress Notes (Signed)
Pharmacy Antibiotic Note  Kristie Allen is a 63 y.o. female admitted on 08/05/2018 with intra abdominal infection.  Pharmacy has been consulted for zosyn dosing.  Plan: Zosyn 3.375g IV q8h (4 hour infusion).  Height: 5\' 5"  (165.1 cm) Weight: 259 lb 7.7 oz (117.7 kg) IBW/kg (Calculated) : 57  Temp (24hrs), Avg:101.2 F (38.4 C), Min:101.2 F (38.4 C), Max:101.2 F (38.4 C)  Recent Labs  Lab 08/01/18 2007 08/01/18 2037 08/02/18 0514 08/03/18 0137 08/05/18 0105  WBC 17.6*  --  18.1* 15.9* 11.5*  CREATININE  --  0.54 0.70  --  0.69  LATICACIDVEN  --  1.5  --   --   --     Estimated Creatinine Clearance: 94.8 mL/min (by C-G formula based on SCr of 0.69 mg/dL).    Allergies  Allergen Reactions  . Bisphosphonates Other (See Comments)    Flu symptoms    Thank you for allowing pharmacy to be a part of this patient's care.  Excell Seltzer Poteet 08/05/2018 5:13 AM

## 2018-08-05 NOTE — H&P (Signed)
History and Physical    Kristie Allen DVV:[id number] DOB: January 19, 1956 DOA: 08/05/2018  PCP: [name]., MD  Patient coming from: Home  I have personally briefly reviewed patient's old medical records in [place]  Chief Complaint: Abd pain  HPI: Kristie Allen is a 63 y.o. female with medical history significant of DM2, HTN, HLD.  Patient recently admitted earlier this week by myself on 4/16 with what was at that time uncomplicated diverticulitis.  Got IV abx and pain ctrl in hospital, discharged home.  Patient now returns to ED with worsening LLQ abd pain.  Now also with fever and chills as well as dry heaves.   ED Course: CT now reveals perforated diverticulitis with small amount of pneumoperitoneum, 2cm abscess vs phlegmon..  Fever of 101.2.  Gen surg consulted and hospitalist asked to admit.   Review of Systems: As per HPI otherwise 10 point review of systems negative.   Past Medical History:  Diagnosis Date  . Diabetes mellitus without complication ([place])   . Diverticulitis 07/2018  . Hypertension   . Hypothyroidism     Past Surgical History:  Procedure Laterality Date  . VARICOSE VEIN SURGERY       reports that she has quit smoking. She has never used smokeless tobacco. She reports that she does not drink alcohol or use drugs.  Allergies  Allergen Reactions  . Bisphosphonates Other (See Comments)    Flu symptoms    Family History  Problem Relation Age of Onset  . Anuerysm Mother   . Diabetes Mother   . Hypertension Mother   . Aneurysm Sister   . Diabetes Sister   . Aneurysm Brother   . Diabetes Brother   . Hypertension Brother   . Diabetes Father   . Heart attack Father   . Hypertension Father      Prior to Admission medications   Not on File    Physical Exam: Vitals:   08/05/18 0230 08/05/18 0345 08/05/18 0400 08/05/18 0415  BP: (!) 130/109 107/77 137/73 (!) 120/56  Pulse: 90 82 83 95  Resp:    16  Temp:      TempSrc:      SpO2:  99% 95% 94% 91%  Weight:      Height:        Constitutional: Mild distress Eyes: PERRL, lids and conjunctivae normal ENMT: Mucous membranes are moist. Posterior pharynx clear of any exudate or lesions.Normal dentition.  Neck: normal, supple, no masses, no thyromegaly Respiratory: clear to auscultation bilaterally, no wheezing, no crackles. Normal respiratory effort. No accessory muscle use.  Cardiovascular: Regular rate and rhythm, no murmurs / rubs / gallops. No extremity edema. 2+ pedal pulses. No carotid bruits.  Abdomen: TTP mostly in LLQ, also some in RLQ. Musculoskeletal: no clubbing / cyanosis. No joint deformity upper and lower extremities. Good ROM, no contractures. Normal muscle tone.  Skin: no rashes, lesions, ulcers. No induration Neurologic: CN 2-12 grossly intact. Sensation intact, DTR normal. Strength 5/5 in all 4.  Psychiatric: Normal judgment and insight. Alert and oriented x 3. Normal mood.    Labs on Admission: I have personally reviewed following labs and imaging studies  CBC: Recent Labs  Lab 08/01/18 2007 08/02/18 0514 08/03/18 0137 08/05/18 0105  WBC 17.6* 18.1* 15.9* 11.5*  NEUTROABS  --   --   --  9.7*  HGB 14.8 13.7 14.0 15.3*  HCT 46.6* 43.2 45.2 49.5*  MCV 84.4 82.4 83.9 84.8  PLT 298 239 255 311  Basic Metabolic Panel: Recent Labs  Lab 08/01/18 2037 08/02/18 0514 08/05/18 0105  NA 134* 134* 133*  K 4.0 3.6 3.7  CL 97* 98 97*  CO2 26 22 21*  GLUCOSE 125* 160* 150*  BUN 15 10 10   CREATININE 0.54 0.70 0.69  CALCIUM 8.8* 8.8* 9.2   GFR: Estimated Creatinine Clearance: 94.8 mL/min (by C-G formula based on SCr of 0.69 mg/dL). Liver Function Tests: Recent Labs  Lab 08/01/18 2037  AST 19  ALT 33  ALKPHOS 79  BILITOT 1.2  PROT 8.1  ALBUMIN 4.3   Recent Labs  Lab 08/01/18 2037  LIPASE 30   No results for input(s): AMMONIA in the last 168 hours. Coagulation Profile: No results for input(s): INR, PROTIME in the last 168 hours.  Cardiac Enzymes: No results for input(s): CKTOTAL, CKMB, CKMBINDEX, TROPONINI in the last 168 hours. BNP (last 3 results) No results for input(s): PROBNP in the last 8760 hours. HbA1C: No results for input(s): HGBA1C in the last 72 hours. CBG: Recent Labs  Lab 08/02/18 0759 08/02/18 1848 08/02/18 2102 08/03/18 0806 08/03/18 1205  GLUCAP 113* 102* 159* 117* 165*   Lipid Profile: No results for input(s): CHOL, HDL, LDLCALC, TRIG, CHOLHDL, LDLDIRECT in the last 72 hours. Thyroid Function Tests: No results for input(s): TSH, T4TOTAL, FREET4, T3FREE, THYROIDAB in the last 72 hours. Anemia Panel: No results for input(s): VITAMINB12, FOLATE, FERRITIN, TIBC, IRON, RETICCTPCT in the last 72 hours. Urine analysis:    Component Value Date/Time   COLORURINE YELLOW 08/05/2018 0224   APPEARANCEUR CLEAR 08/05/2018 0224   LABSPEC 1.028 08/05/2018 0224   PHURINE 5.0 08/05/2018 0224   GLUCOSEU >=500 (A) 08/05/2018 0224   HGBUR NEGATIVE 08/05/2018 0224   BILIRUBINUR NEGATIVE 08/05/2018 0224   KETONESUR 5 (A) 08/05/2018 0224   PROTEINUR NEGATIVE 08/05/2018 0224   NITRITE NEGATIVE 08/05/2018 0224   LEUKOCYTESUR NEGATIVE 08/05/2018 0224    Radiological Exams on Admission: Ct Abdomen Pelvis W Contrast  Result Date: 08/05/2018 CLINICAL DATA:  63 y/o F; lower abdominal pain with increasing severity of the right lower quadrant. EXAM: CT ABDOMEN AND PELVIS WITH CONTRAST TECHNIQUE: Multidetector CT imaging of the abdomen and pelvis was performed using the standard protocol following bolus administration of intravenous contrast. CONTRAST:  127mL OMNIPAQUE IOHEXOL 300 MG/ML  SOLN COMPARISON:  None. FINDINGS: Lower chest: Small hiatal hernia. Hepatobiliary: No focal liver abnormality is seen. No gallstones, gallbladder wall thickening, or biliary dilatation. Pancreas: Unremarkable. No pancreatic ductal dilatation or surrounding inflammatory changes. Spleen: Normal in size without focal abnormality.  Adrenals/Urinary Tract: Adrenal glands are unremarkable. Kidneys are normal, without renal calculi, focal lesion, or hydronephrosis. Bladder is unremarkable. Stomach/Bowel: Acute perforated sigmoid diverticulitis. Trace volume of pneumoperitoneum. Small volume of free fluid within the pelvis and left lower quadrant as well as a 26 mm collection anterior to the psoas muscle at pelvic brim (series 3, image 62). No additional obstructive or inflammatory changes of the small or large bowel. Vascular/Lymphatic: Aortic atherosclerosis. No enlarged abdominal or pelvic lymph nodes. Reproductive: Uterus and bilateral adnexa are unremarkable. Other: No abdominal wall hernia or abnormality. Musculoskeletal: No fracture is seen. IMPRESSION: 1. Acute perforated sigmoid diverticulitis. Trace pneumoperitoneum small volume free fluid within pelvis and left lower quadrant. 2. 26 mm discrete fluid collection anterior to the left psoas muscle at pelvic brim, probably a developing abscess. 3. Small hiatal hernia. 4. Aortic Atherosclerosis (ICD10-I70.0). Electronically Signed   By: Kristine Garbe M.D.   On: 08/05/2018 03:28    EKG:  Independently reviewed.  Assessment/Plan Principal Problem:   Diverticulitis of large intestine with perforation Active Problems:   DM2 (diabetes mellitus, type 2) ([facility])   HLD (hyperlipidemia)   HTN (hypertension)    1. Diverticulitis, now progressed to perforation -  1. See Dr. Josetta Huddle note - 1. Non-surgical management at the moment 2. IV zosyn 3. Keep NPO 2. Morphine PRN pain 3. Zofran PRN nausea 4. IVF: NS at 125 cc/hr 2. DM2 - 1. Sensitive SSI Q4H 2. Holding home PO hypoglycemics 3. HLD - 1. Cont statin and zetia 4. HTN - 1. Will hold both lisinopril and HCTZ at this time  DVT prophylaxis: Lovenox Code Status: Full Family Communication: No family in room Disposition Plan: Home after admit Consults called: None Admission status: Admit to inpatient - inpatient  status as patient has quite clearly failed outpatient treatment.     Kellan Raffield Jerilynn Mages DO [facility]  How to contact the [facility] Attending or Consulting provider [place] or covering provider during after hours [place], for this patient?  1. Check the care team in [facility] and look for a) attending/consulting [facility] provider listed and b) the [facility] team listed 2. Log into [email or web address]  Amion Physician Scheduling and messaging for groups and whole hospitals  On call and physician scheduling software for group practices, residents, hospitalists and other medical providers for call, clinic, rotation and shift schedules. OnCall [employer] is a hospital-wide system for scheduling doctors and paging doctors on call. EasyPlot is for scientific plotting and data analysis.  [email or web address]  and use [place] universal password to access. If you do not have the password, please contact the hospital operator.  3. Locate the [facility] provider you are looking for under [facility] and page to a number that you can be directly reached. 4. If you still have difficulty reaching the provider, please page the [facility] (Director on Call) for the Hospitalists listed on amion for assistance.  08/05/2018, 5:25 AM

## 2018-08-05 NOTE — ED Triage Notes (Signed)
Reports being discharged recently after having diverticulitis.  Reports pain has been intermittent today but worse around 2100.  Also c/o nausea but no vomiting.

## 2018-08-06 ENCOUNTER — Other Ambulatory Visit: Payer: Self-pay

## 2018-08-06 LAB — BASIC METABOLIC PANEL
Anion gap: 16 — ABNORMAL HIGH (ref 5–15)
BUN: 10 mg/dL (ref 8–23)
CO2: 18 mmol/L — ABNORMAL LOW (ref 22–32)
Calcium: 8.5 mg/dL — ABNORMAL LOW (ref 8.9–10.3)
Chloride: 101 mmol/L (ref 98–111)
Creatinine, Ser: 0.84 mg/dL (ref 0.44–1.00)
GFR calc Af Amer: >60 mL/min (ref >60)
GFR calc non Af Amer: >60 mL/min (ref >60)
Glucose, Bld: 125 mg/dL — ABNORMAL HIGH (ref 70–99)
Potassium: 3.7 mmol/L (ref 3.5–5.1)
Sodium: 135 mmol/L (ref 135–145)

## 2018-08-06 LAB — CBC
HCT: 42.3 % (ref 36.0–46.0)
Hemoglobin: 14 g/dL (ref 12.0–15.0)
MCH: 27.2 pg (ref 26.0–34.0)
MCHC: 33.1 g/dL (ref 30.0–36.0)
MCV: 82.3 fL (ref 80.0–100.0)
Platelets: 260 10*3/uL (ref 150–400)
RBC: 5.14 MIL/uL — ABNORMAL HIGH (ref 3.87–5.11)
RDW: 14.1 % (ref 11.5–15.5)
WBC: 15.4 10*3/uL — ABNORMAL HIGH (ref 4.0–10.5)
nRBC: 0 % (ref 0.0–0.2)

## 2018-08-06 LAB — GLUCOSE, CAPILLARY
Glucose-Capillary: 104 mg/dL — ABNORMAL HIGH (ref 70–99)
Glucose-Capillary: 116 mg/dL — ABNORMAL HIGH (ref 70–99)
Glucose-Capillary: 123 mg/dL — ABNORMAL HIGH (ref 70–99)
Glucose-Capillary: 83 mg/dL (ref 70–99)
Glucose-Capillary: 84 mg/dL (ref 70–99)
Glucose-Capillary: 94 mg/dL (ref 70–99)

## 2018-08-06 MED ORDER — ENOXAPARIN (LOVENOX) PATIENT EDUCATION KIT
PACK | Freq: Once | Status: AC
  Administered 2018-08-06: 18:00:00

## 2018-08-06 MED ORDER — ORAL CARE MOUTH RINSE
15.0000 mL | Freq: Two times a day (BID) | OROMUCOSAL | Status: DC
  Administered 2018-08-06 – 2018-08-14 (×9): 15 mL via OROMUCOSAL

## 2018-08-06 NOTE — Plan of Care (Signed)
  Problem: Activity: Goal: Risk for activity intolerance will decrease Outcome: Progressing   Problem: Elimination: Goal: Will not experience complications related to urinary retention Outcome: Not Applicable   Problem: Pain Managment: Goal: General experience of comfort will improve Outcome: Progressing

## 2018-08-06 NOTE — Plan of Care (Signed)
  Problem: Clinical Measurements: Goal: Cardiovascular complication will be avoided Outcome: Progressing   Problem: Pain Managment: Goal: General experience of comfort will improve Outcome: Progressing   Problem: Safety: Goal: Ability to remain free from injury will improve Outcome: Progressing   

## 2018-08-06 NOTE — Progress Notes (Signed)
PROGRESS NOTE   Kristie Allen  YWV:[id number]    DOB: 11-11-1955    DOA: 08/05/2018  PCP: [name]., MD   I have briefly reviewed patients previous medical records in [place].  Brief Narrative:  63 year old female with PMH of DM 2, HTN, HLD, hypothyroid, recent hospitalization 8/54-6/27 for uncomplicated diverticulitis, did well with conservative management and discharged home, returned with worsening LLQ abdominal pain, fever, chills and dry heaves.  Admitted for acute perforated sigmoid diverticulitis with trace pneumoperitoneum and possible developing pelvic abscess.  Managing conservatively.  General surgery on board.  Slowly improving.   Assessment & Plan:   Principal Problem:   Diverticulitis of large intestine with perforation Active Problems:   DM2 (diabetes mellitus, type 2) ([place])   HLD (hyperlipidemia)   HTN (hypertension)   1. Acute perforated sigmoid diverticulitis with trace pneumoperitoneum and possibly developing pelvic abscess: General surgery consulted.  Managing conservatively with NPO, IV fluids, pain management and IV Zosyn.  Patient states that she has had a colonoscopy within the last 10 years by GI in [facility] and may be due again at this time.  Recommend colonoscopy after acute diverticulitis is completely resolved, in 6 to 8 weeks.  She feels better.  WBC still up.  No fevers.  Surgery allowing ice chips today but holding off on advancing to clears until pain and leukocytosis better.  If does not make sustained improvement or worsens, may need repeat CT to reassess abscess and may need surgical intervention.  Mobilize. 2. Type II DM: Hold oral hypoglycemics.  Continue SSI.  Controlled. 3. Essential hypertension: Controlled.  Holding HCTZ and lisinopril at this time. 4. Hyperlipidemia: Continue statins and Zetia. 5. Hypothyroid: Continue Synthroid. 6. Morbid obesity/Body mass index is 43.18 kg/m.    DVT prophylaxis: Lovenox Code Status:  Full Family Communication: None at bedside Disposition: DC home pending clinical improvement   Consultants:  General surgery  Procedures:  None  Antimicrobials:  IV Zosyn   Subjective: States that abdominal pain is better.  No distention.  No nausea or vomiting.  No BM or flatus since admission.  As per RN at bedside, no acute issues noted.  ROS: As above, otherwise negative.  Objective:  Vitals:   08/05/18 1411 08/05/18 2024 08/06/18 0509 08/06/18 1259  BP: (!) 148/80 140/74 135/76 132/73  Pulse: 85 78 75 79  Resp: 18 18 18 16   Temp: 99.2 F (37.3 C) 99.5 F (37.5 C) 98.6 F (37 C) 98.2 F (36.8 C)  TempSrc: Oral Oral Oral Oral  SpO2: 95% 92% 94% 95%  Weight:      Height:        Examination:  General exam: Pleasant young female, moderately built and obese lying comfortably supine in bed.  Oral mucosa moist. Respiratory system: Clear to auscultation. Respiratory effort normal.  Stable. Cardiovascular system: S1 & S2 heard, RRR. No JVD, murmurs, rubs, gallops or clicks. No pedal edema.  Stable. Gastrointestinal system: Abdomen is nondistended, soft and LLQ tenderness with mild guarding but no rigidity or rebound.  Seems slightly less tender today than yesterday.  No organomegaly or masses felt. Normal bowel sounds heard. Central nervous system: Alert and oriented. No focal neurological deficits. Extremities: Symmetric 5 x 5 power. Skin: No rashes, lesions or ulcers Psychiatry: Judgement and insight appear normal. Mood & affect appropriate.     Data Reviewed: I have personally reviewed following labs and imaging studies  CBC: Recent Labs  Lab 08/01/18 2007 08/02/18 0350 08/03/18 0938  08/05/18 0105 08/06/18 0454  WBC 17.6* 18.1* 15.9* 11.5* 15.4*  NEUTROABS  --   --   --  9.7*  --   HGB 14.8 13.7 14.0 15.3* 14.0  HCT 46.6* 43.2 45.2 49.5* 42.3  MCV 84.4 82.4 83.9 84.8 82.3  PLT 298 239 255 311 098   Basic Metabolic Panel: Recent Labs  Lab  08/01/18 2037 08/02/18 0514 08/05/18 0105 08/06/18 0454  NA 134* 134* 133* 135  K 4.0 3.6 3.7 3.7  CL 97* 98 97* 101  CO2 26 22 21* 18*  GLUCOSE 125* 160* 150* 125*  BUN 15 10 10 10   CREATININE 0.54 0.70 0.69 0.84  CALCIUM 8.8* 8.8* 9.2 8.5*   Liver Function Tests: Recent Labs  Lab 08/01/18 2037  AST 19  ALT 33  ALKPHOS 79  BILITOT 1.2  PROT 8.1  ALBUMIN 4.3   CBG: Recent Labs  Lab 08/05/18 2020 08/06/18 0027 08/06/18 0324 08/06/18 0726 08/06/18 1130  GLUCAP 100* 84 123* 116* 94    No results found for this or any previous visit (from the past 240 hour(s)).       Radiology Studies: Ct Abdomen Pelvis W Contrast  Result Date: 08/05/2018 CLINICAL DATA:  63 y/o F; lower abdominal pain with increasing severity of the right lower quadrant. EXAM: CT ABDOMEN AND PELVIS WITH CONTRAST TECHNIQUE: Multidetector CT imaging of the abdomen and pelvis was performed using the standard protocol following bolus administration of intravenous contrast. CONTRAST:  135mL OMNIPAQUE IOHEXOL 300 MG/ML  SOLN COMPARISON:  None. FINDINGS: Lower chest: Small hiatal hernia. Hepatobiliary: No focal liver abnormality is seen. No gallstones, gallbladder wall thickening, or biliary dilatation. Pancreas: Unremarkable. No pancreatic ductal dilatation or surrounding inflammatory changes. Spleen: Normal in size without focal abnormality. Adrenals/Urinary Tract: Adrenal glands are unremarkable. Kidneys are normal, without renal calculi, focal lesion, or hydronephrosis. Bladder is unremarkable. Stomach/Bowel: Acute perforated sigmoid diverticulitis. Trace volume of pneumoperitoneum. Small volume of free fluid within the pelvis and left lower quadrant as well as a 26 mm collection anterior to the psoas muscle at pelvic brim (series 3, image 62). No additional obstructive or inflammatory changes of the small or large bowel. Vascular/Lymphatic: Aortic atherosclerosis. No enlarged abdominal or pelvic lymph nodes.  Reproductive: Uterus and bilateral adnexa are unremarkable. Other: No abdominal wall hernia or abnormality. Musculoskeletal: No fracture is seen. IMPRESSION: 1. Acute perforated sigmoid diverticulitis. Trace pneumoperitoneum small volume free fluid within pelvis and left lower quadrant. 2. 26 mm discrete fluid collection anterior to the left psoas muscle at pelvic brim, probably a developing abscess. 3. Small hiatal hernia. 4. Aortic Atherosclerosis (ICD10-I70.0). Electronically Signed   By: Kristine Garbe M.D.   On: 08/05/2018 03:28        Scheduled Meds:  atorvastatin  80 mg Oral Daily   enoxaparin (LOVENOX) injection  40 mg Subcutaneous Q24H   enoxaparin   Does not apply Once   ezetimibe  10 mg Oral Daily   insulin aspart  0-9 Units Subcutaneous Q4H   levothyroxine  125 mcg Oral Q0600   mouth rinse  15 mL Mouth Rinse BID   pantoprazole  40 mg Oral Daily   Continuous Infusions:  sodium chloride 125 mL/hr at 08/06/18 0530   piperacillin-tazobactam (ZOSYN)  IV 3.375 g (08/06/18 0836)     LOS: 1 day     Vernell Leep, MD, FACP, [facility]. [facility]  To contact the attending provider between 7A-7P or the covering provider during after hours 7P-7A, please log into the  web site [email or web address] and access using universal [place] password for that web site. If you do not have the password, please call the hospital operator.  08/06/2018, 3:47 PM

## 2018-08-06 NOTE — Progress Notes (Signed)
[facility] Progress Note     Subjective: CC: diverticulitis Patient reports she feels a little better today than yesterday. Reports some mild-moderate tenderness across lower abdomen. Pain is intermittent. Denies nausea or vomiting. No flatus or BM.   Objective: Vital signs in last 24 hours: Temp:  [98.6 F (37 C)-99.5 F (37.5 C)] 98.6 F (37 C) (04/20 0509) Pulse Rate:  [75-85] 75 (04/20 0509) Resp:  [18] 18 (04/20 0509) BP: (119-148)/(67-80) 135/76 (04/20 0509) SpO2:  [92 %-97 %] 94 % (04/20 0509) Last BM Date: 08/03/18  Intake/Output from previous day: 04/19 0701 - 04/20 0700 In: 1421.1 [P.O.:75; I.V.:1202; IV Piggyback:144] Out: -  Intake/Output this shift: No intake/output data recorded.  PE: Gen:  Alert, NAD, pleasant Card:  Regular rate and rhythm, pedal pulses 2+ BL Pulm:  Normal effort, clear to auscultation bilaterally Abd: Soft, mildly TTP in lower abdomen - specifically LLQ, no rebound or guarding, non-distended, +BS Skin: warm and dry, no rashes  Psych: A&Ox3   Lab Results:  Recent Labs    08/05/18 0105 08/06/18 0454  WBC 11.5* 15.4*  HGB 15.3* 14.0  HCT 49.5* 42.3  PLT 311 260   BMET Recent Labs    08/05/18 0105 08/06/18 0454  NA 133* 135  K 3.7 3.7  CL 97* 101  CO2 21* 18*  GLUCOSE 150* 125*  BUN 10 10  CREATININE 0.69 0.84  CALCIUM 9.2 8.5*   PT/INR No results for input(s): LABPROT, INR in the last 72 hours. CMP     Component Value Date/Time   NA 135 08/06/2018 0454   K 3.7 08/06/2018 0454   CL 101 08/06/2018 0454   CO2 18 (L) 08/06/2018 0454   GLUCOSE 125 (H) 08/06/2018 0454   BUN 10 08/06/2018 0454   CREATININE 0.84 08/06/2018 0454   CALCIUM 8.5 (L) 08/06/2018 0454   PROT 8.1 08/01/2018 2037   ALBUMIN 4.3 08/01/2018 2037   AST 19 08/01/2018 2037   ALT 33 08/01/2018 2037   ALKPHOS 79 08/01/2018 2037   BILITOT 1.2 08/01/2018 2037   GFRNONAA >60 08/06/2018 0454   GFRAA >60 08/06/2018 0454   Lipase      Component Value Date/Time   LIPASE 30 08/01/2018 2037       Studies/Results: Ct Abdomen Pelvis W Contrast  Result Date: 08/05/2018 CLINICAL DATA:  63 y/o F; lower abdominal pain with increasing severity of the right lower quadrant. EXAM: CT ABDOMEN AND PELVIS WITH CONTRAST TECHNIQUE: Multidetector CT imaging of the abdomen and pelvis was performed using the standard protocol following bolus administration of intravenous contrast. CONTRAST:  155mL OMNIPAQUE IOHEXOL 300 MG/ML  SOLN COMPARISON:  None. FINDINGS: Lower chest: Small hiatal hernia. Hepatobiliary: No focal liver abnormality is seen. No gallstones, gallbladder wall thickening, or biliary dilatation. Pancreas: Unremarkable. No pancreatic ductal dilatation or surrounding inflammatory changes. Spleen: Normal in size without focal abnormality. Adrenals/Urinary Tract: Adrenal glands are unremarkable. Kidneys are normal, without renal calculi, focal lesion, or hydronephrosis. Bladder is unremarkable. Stomach/Bowel: Acute perforated sigmoid diverticulitis. Trace volume of pneumoperitoneum. Small volume of free fluid within the pelvis and left lower quadrant as well as a 26 mm collection anterior to the psoas muscle at pelvic brim (series 3, image 62). No additional obstructive or inflammatory changes of the small or large bowel. Vascular/Lymphatic: Aortic atherosclerosis. No enlarged abdominal or pelvic lymph nodes. Reproductive: Uterus and bilateral adnexa are unremarkable. Other: No abdominal wall hernia or abnormality. Musculoskeletal: No fracture is seen. IMPRESSION: 1. Acute perforated sigmoid diverticulitis.  Trace pneumoperitoneum small volume free fluid within pelvis and left lower quadrant. 2. 26 mm discrete fluid collection anterior to the left psoas muscle at pelvic brim, probably a developing abscess. 3. Small hiatal hernia. 4. Aortic Atherosclerosis (ICD10-I70.0). Electronically Signed   By: Kristine Garbe M.D.   On: 08/05/2018  03:28    Anti-infectives: Anti-infectives (From admission, onward)   Start     Dose/Rate Route Frequency Ordered Stop   08/05/18 0800  piperacillin-tazobactam (ZOSYN) IVPB 3.375 g     3.375 g 12.5 mL/hr over 240 Minutes Intravenous Every 8 hours 08/05/18 0508     08/05/18 0130  piperacillin-tazobactam (ZOSYN) IVPB 3.375 g     3.375 g 100 mL/hr over 30 Minutes Intravenous  Once 08/05/18 0123 08/05/18 0211       Assessment/Plan HTN T2DM HLD Hypothyroidism Morbid Obesity - BMI 43 AKI - Cr 0.84 from 0.69, UOP good, continue to monitor  Acute sigmoid diverticulitis with microperforation and possible developing abscess - WBC 15.4, afebrile - continue IV abx - clinically patient feels improved - will allow ice chips today but would hold off on advancing to clears until pain improved and WBC better - ideally this would resolve without acute surgical intervention, but if patient worsens or fails to improve may need resection - we will continue to follow  - recommend colonoscopy in 6-8 weeks   FEN: ice chips, IVF VTE: SCDs, lovenox ID: Zosyn 4/19>>  LOS: 1 day    Brigid Re , [facility] Surgery 08/06/2018, 8:58 AM Pager: [place]: [phone]

## 2018-08-06 NOTE — Plan of Care (Signed)
  Problem: Activity: Goal: Risk for activity intolerance will decrease 08/06/2018 1334 by Byrd Hesselbach, Melrose Nakayama, RN Outcome: Progressing 08/06/2018 1334 by Byrd Hesselbach, [place] Nakayama, RN Outcome: Progressing

## 2018-08-07 LAB — CBC
HCT: 42.5 % (ref 36.0–46.0)
Hemoglobin: 13.2 g/dL (ref 12.0–15.0)
MCH: 25.9 pg — ABNORMAL LOW (ref 26.0–34.0)
MCHC: 31.1 g/dL (ref 30.0–36.0)
MCV: 83.3 fL (ref 80.0–100.0)
Platelets: 316 10*3/uL (ref 150–400)
RBC: 5.1 MIL/uL (ref 3.87–5.11)
RDW: 14 % (ref 11.5–15.5)
WBC: 18 10*3/uL — ABNORMAL HIGH (ref 4.0–10.5)
nRBC: 0 % (ref 0.0–0.2)

## 2018-08-07 LAB — BASIC METABOLIC PANEL
Anion gap: 16 — ABNORMAL HIGH (ref 5–15)
BUN: 15 mg/dL (ref 8–23)
CO2: 18 mmol/L — ABNORMAL LOW (ref 22–32)
Calcium: 8.6 mg/dL — ABNORMAL LOW (ref 8.9–10.3)
Chloride: 102 mmol/L (ref 98–111)
Creatinine, Ser: 0.95 mg/dL (ref 0.44–1.00)
GFR calc Af Amer: >60 mL/min (ref >60)
GFR calc non Af Amer: >60 mL/min (ref >60)
Glucose, Bld: 106 mg/dL — ABNORMAL HIGH (ref 70–99)
Potassium: 4.1 mmol/L (ref 3.5–5.1)
Sodium: 136 mmol/L (ref 135–145)

## 2018-08-07 LAB — GLUCOSE, CAPILLARY
Glucose-Capillary: 103 mg/dL — ABNORMAL HIGH (ref 70–99)
Glucose-Capillary: 103 mg/dL — ABNORMAL HIGH (ref 70–99)
Glucose-Capillary: 104 mg/dL — ABNORMAL HIGH (ref 70–99)
Glucose-Capillary: 106 mg/dL — ABNORMAL HIGH (ref 70–99)
Glucose-Capillary: 79 mg/dL (ref 70–99)
Glucose-Capillary: 83 mg/dL (ref 70–99)
Glucose-Capillary: 98 mg/dL (ref 70–99)

## 2018-08-07 MED ORDER — SODIUM CHLORIDE 0.9 % IV SOLN
1.0000 g | Freq: Three times a day (TID) | INTRAVENOUS | Status: DC
  Administered 2018-08-07 – 2018-08-14 (×22): 1 g via INTRAVENOUS
  Filled 2018-08-07 (×25): qty 1

## 2018-08-07 NOTE — Progress Notes (Signed)
PROGRESS NOTE    Kristie Allen  RJJ:[id number] DOB: 01-Dec-1955 DOA: 08/05/2018 PCP: [name]., MD    Brief Narrative:  63 year old female with PMH of DM 2, HTN, HLD, hypothyroid, recent hospitalization 0/16-0/10 for uncomplicated diverticulitis, did well with conservative management and discharged home, returned with worsening LLQ abdominal pain, fever, chills and dry heaves.  Admitted for acute perforated sigmoid diverticulitis with trace pneumoperitoneum and possible developing pelvic abscess.  Managing conservatively.  General surgery on board.  Slowly improving.   Assessment & Plan:   Principal Problem:   Diverticulitis of large intestine with perforation Active Problems:   DM2 (diabetes mellitus, type 2) ([place])   HLD (hyperlipidemia)   HTN (hypertension)   Sigmoid diverticulitis with trace pneumoperitoneum possibly developing pelvic abscess Surgery consulted recommended conservative management with IV fluids, pain management.on IV zosyn,  Surgery on board.  continue to monitor.    Type 2 DM:  CONTINUE SSI.    Hypertension:  Well controlled.   Hypothyroidism:  Resume synthroid.    Morbid obesity/  BMP is 43.      DVT prophylaxis: Lovenox Code Status : full code Family Communication: None at bedside  disposition Plan: Clinical improvement   Consultants:    surgery   Procedures: none   Antimicrobials: IV Zosyn  Subjective: Nausea improved abdominal pain is better.  No vomiting.  Objective: Vitals:   08/06/18 1259 08/06/18 2006 08/07/18 0415 08/07/18 1303  BP: 132/73 (!) 152/75 139/66 (!) 141/79  Pulse: 79 71 64 70  Resp: 16 18 18 16   Temp: 98.2 F (36.8 C) 98.5 F (36.9 C) 98.5 F (36.9 C) 97.8 F (36.6 C)  TempSrc: Oral Oral Oral Oral  SpO2: 95% 97% 94% (!) 83%  Weight:      Height:        Intake/Output Summary (Last 24 hours) at 08/07/2018 1550 Last data filed at 08/07/2018 1300 Gross per 24 hour  Intake 713.72 ml  Output -   Net 713.72 ml   Filed Weights   08/05/18 0057  Weight: 117.7 kg    Examination:  General exam: Appears calm and comfortable  Respiratory system: Clear to auscultation. Respiratory effort normal. Cardiovascular system: S1 & S2 heard, RRR. No JVD, murmurs, rubs, gallops or clicks. No pedal edema. Gastrointestinal system: Abdomen is nondistended, soft and nontender. No organomegaly or masses felt. Normal bowel sounds heard. Central nervous system: Alert and oriented. No focal neurological deficits. Extremities: Symmetric 5 x 5 power. Skin: No rashes, lesions or ulcers Psychiatry: Judgement and insight appear normal. Mood & affect appropriate.     Data Reviewed: I have personally reviewed following labs and imaging studies  CBC: Recent Labs  Lab 08/02/18 0514 08/03/18 0137 08/05/18 0105 08/06/18 0454 08/07/18 0301  WBC 18.1* 15.9* 11.5* 15.4* 18.0*  NEUTROABS  --   --  9.7*  --   --   HGB 13.7 14.0 15.3* 14.0 13.2  HCT 43.2 45.2 49.5* 42.3 42.5  MCV 82.4 83.9 84.8 82.3 83.3  PLT 239 255 311 260 932   Basic Metabolic Panel: Recent Labs  Lab 08/01/18 2037 08/02/18 0514 08/05/18 0105 08/06/18 0454 08/07/18 0301  NA 134* 134* 133* 135 136  K 4.0 3.6 3.7 3.7 4.1  CL 97* 98 97* 101 102  CO2 26 22 21* 18* 18*  GLUCOSE 125* 160* 150* 125* 106*  BUN 15 10 10 10 15   CREATININE 0.54 0.70 0.69 0.84 0.95  CALCIUM 8.8* 8.8* 9.2 8.5* 8.6*   GFR: Estimated Creatinine  Clearance: 79.8 mL/min (by C-G formula based on SCr of 0.95 mg/dL). Liver Function Tests: Recent Labs  Lab 08/01/18 2037  AST 19  ALT 33  ALKPHOS 79  BILITOT 1.2  PROT 8.1  ALBUMIN 4.3   Recent Labs  Lab 08/01/18 2037  LIPASE 30   No results for input(s): AMMONIA in the last 168 hours. Coagulation Profile: No results for input(s): INR, PROTIME in the last 168 hours. Cardiac Enzymes: No results for input(s): CKTOTAL, CKMB, CKMBINDEX, TROPONINI in the last 168 hours. BNP (last 3 results) No  results for input(s): PROBNP in the last 8760 hours. HbA1C: No results for input(s): HGBA1C in the last 72 hours. CBG: Recent Labs  Lab 08/06/18 2004 08/07/18 0009 08/07/18 0412 08/07/18 0733 08/07/18 1143  GLUCAP 104* 103* 106* 103* 104*   Lipid Profile: No results for input(s): CHOL, HDL, LDLCALC, TRIG, CHOLHDL, LDLDIRECT in the last 72 hours. Thyroid Function Tests: No results for input(s): TSH, T4TOTAL, FREET4, T3FREE, THYROIDAB in the last 72 hours. Anemia Panel: No results for input(s): VITAMINB12, FOLATE, FERRITIN, TIBC, IRON, RETICCTPCT in the last 72 hours. Sepsis Labs: Recent Labs  Lab 08/01/18 2037  LATICACIDVEN 1.5    No results found for this or any previous visit (from the past 240 hour(s)).       Radiology Studies: No results found.      Scheduled Meds: . atorvastatin  80 mg Oral Daily  . enoxaparin (LOVENOX) injection  40 mg Subcutaneous Q24H  . ezetimibe  10 mg Oral Daily  . insulin aspart  0-9 Units Subcutaneous Q4H  . levothyroxine  125 mcg Oral Q0600  . mouth rinse  15 mL Mouth Rinse BID  . pantoprazole  40 mg Oral Daily   Continuous Infusions: . sodium chloride 75 mL/hr at 08/07/18 1518  . meropenem (MERREM) IV 1 g (08/07/18 1315)     LOS: 2 days    Time spent: 81 minutes./     Hosie Poisson, MD [facility] Pager [phone]  If 7PM-7AM, please contact night-coverage [email or web address] Password [email or web address] 08/07/2018, 3:50 PM

## 2018-08-07 NOTE — Progress Notes (Signed)
[facility] Progress Note     Subjective: CC: diverticulitis Patient feels like pain is slightly improved from yesterday. More localized to LLQ now. Passing flatus, no BM. Tolerating ice chips. Afebrile. UOP good.   Objective: Vital signs in last 24 hours: Temp:  [98.2 F (36.8 C)-98.5 F (36.9 C)] 98.5 F (36.9 C) (04/21 0415) Pulse Rate:  [64-79] 64 (04/21 0415) Resp:  [16-18] 18 (04/21 0415) BP: (132-152)/(66-75) 139/66 (04/21 0415) SpO2:  [94 %-97 %] 94 % (04/21 0415) Last BM Date: 08/03/18  Intake/Output from previous day: 04/20 0701 - 04/21 0700 In: 713.7 [P.O.:60; I.V.:603.7; IV Piggyback:50] Out: 250 [Urine:250] Intake/Output this shift: No intake/output data recorded.  PE: Gen:  Alert, NAD, pleasant Card:  Regular rate and rhythm, pedal pulses 2+ BL Pulm:  Normal effort, clear to auscultation bilaterally Abd: Soft, mildly TTP in LLQ, no rebound or guarding, non-distended, +BS Skin: warm and dry, no rashes  Psych: A&Ox3   Lab Results:  Recent Labs    08/06/18 0454 08/07/18 0301  WBC 15.4* 18.0*  HGB 14.0 13.2  HCT 42.3 42.5  PLT 260 316   BMET Recent Labs    08/06/18 0454 08/07/18 0301  NA 135 136  K 3.7 4.1  CL 101 102  CO2 18* 18*  GLUCOSE 125* 106*  BUN 10 15  CREATININE 0.84 0.95  CALCIUM 8.5* 8.6*   PT/INR No results for input(s): LABPROT, INR in the last 72 hours. CMP     Component Value Date/Time   NA 136 08/07/2018 0301   K 4.1 08/07/2018 0301   CL 102 08/07/2018 0301   CO2 18 (L) 08/07/2018 0301   GLUCOSE 106 (H) 08/07/2018 0301   BUN 15 08/07/2018 0301   CREATININE 0.95 08/07/2018 0301   CALCIUM 8.6 (L) 08/07/2018 0301   PROT 8.1 08/01/2018 2037   ALBUMIN 4.3 08/01/2018 2037   AST 19 08/01/2018 2037   ALT 33 08/01/2018 2037   ALKPHOS 79 08/01/2018 2037   BILITOT 1.2 08/01/2018 2037   GFRNONAA >60 08/07/2018 0301   GFRAA >60 08/07/2018 0301   Lipase     Component Value Date/Time   LIPASE 30 08/01/2018  2037       Studies/Results: No results found.  Anti-infectives: Anti-infectives (From admission, onward)   Start     Dose/Rate Route Frequency Ordered Stop   08/05/18 0800  piperacillin-tazobactam (ZOSYN) IVPB 3.375 g     3.375 g 12.5 mL/hr over 240 Minutes Intravenous Every 8 hours 08/05/18 0508     08/05/18 0130  piperacillin-tazobactam (ZOSYN) IVPB 3.375 g     3.375 g 100 mL/hr over 30 Minutes Intravenous  Once 08/05/18 0123 08/05/18 0211       Assessment/Plan HTN T2DM HLD Hypothyroidism Morbid Obesity - BMI 43 AKI - Cr 0.95, UOP good, continue to monitor  Acute sigmoid diverticulitis with microperforation and possible developing abscess - WBC increased to 18 from 15 yesterday, afebrile - change abx to merrem to broaden coverage, too soon for repeat CT I think with last one being only 2 days ago - clinically patient feels improved - continue ice chips only for now - ideally this would resolve without acute surgical intervention, but if patient worsens or fails to improve may need resection - we will continue to follow  - recommend colonoscopy in 6-8 weeks   FEN: ice chips, IVF VTE: SCDs, lovenox ID: Zosyn 4/19>4/21; Merrem 4/22>>  LOS: 2 days    Kristie Allen , [facility]  08/07/2018, 9:27 AM Pager: [phone] Consults: [phone]

## 2018-08-07 NOTE — Plan of Care (Signed)
  Problem: Pain Managment: Goal: General experience of comfort will improve Outcome: Progressing   Problem: Safety: Goal: Ability to remain free from injury will improve Outcome: Progressing   Problem: Skin Integrity: Goal: Risk for impaired skin integrity will decrease Outcome: Progressing   

## 2018-08-07 NOTE — Progress Notes (Signed)
Pharmacy Antibiotic Note  Kristie Allen is a 63 y.o. female admitted on 08/05/2018 with intra abdominal infection.  Pharmacy has been consulted to change Zosyn to Merrem dosing due to worsening leukocytosis. Patient remains afebrile. Patient was recently in hospital. No surgery or steroids. No cultures. SCr 0.95 with CrCl ~80 mL/min.   Plan: Stop Zosyn. Start Merrem 1g IV every 8 hours. Monitor leukocytosis, clinical status, and renal function.   Height: 5\' 5"  (165.1 cm) Weight: 259 lb 7.7 oz (117.7 kg) IBW/kg (Calculated) : 57  Temp (24hrs), Avg:98.4 F (36.9 C), Min:98.2 F (36.8 C), Max:98.5 F (36.9 C)  Recent Labs  Lab 08/01/18 2037 08/02/18 0514 08/03/18 0137 08/05/18 0105 08/06/18 0454 08/07/18 0301  WBC  --  18.1* 15.9* 11.5* 15.4* 18.0*  CREATININE 0.54 0.70  --  0.69 0.84 0.95  LATICACIDVEN 1.5  --   --   --   --   --     Estimated Creatinine Clearance: 79.8 mL/min (by C-G formula based on SCr of 0.95 mg/dL).    Allergies  Allergen Reactions  . Bisphosphonates Other (See Comments)    Flu symptoms    Thank you for allowing pharmacy to be a part of this patient's care.  Sloan Leiter, PharmD, BCPS, BCCCP Clinical Pharmacist Please refer to [facility] for [place] numbers 08/07/2018 9:46 AM

## 2018-08-08 LAB — CBC
HCT: 37.8 % (ref 36.0–46.0)
Hemoglobin: 12.3 g/dL (ref 12.0–15.0)
MCH: 26.9 pg (ref 26.0–34.0)
MCHC: 32.5 g/dL (ref 30.0–36.0)
MCV: 82.7 fL (ref 80.0–100.0)
Platelets: 327 10*3/uL (ref 150–400)
RBC: 4.57 MIL/uL (ref 3.87–5.11)
RDW: 14.4 % (ref 11.5–15.5)
WBC: 15.5 10*3/uL — ABNORMAL HIGH (ref 4.0–10.5)
nRBC: 0 % (ref 0.0–0.2)

## 2018-08-08 LAB — BASIC METABOLIC PANEL
Anion gap: 13 (ref 5–15)
BUN: 13 mg/dL (ref 8–23)
CO2: 20 mmol/L — ABNORMAL LOW (ref 22–32)
Calcium: 8.3 mg/dL — ABNORMAL LOW (ref 8.9–10.3)
Chloride: 106 mmol/L (ref 98–111)
Creatinine, Ser: 0.73 mg/dL (ref 0.44–1.00)
GFR calc Af Amer: >60 mL/min (ref >60)
GFR calc non Af Amer: >60 mL/min (ref >60)
Glucose, Bld: 90 mg/dL (ref 70–99)
Potassium: 3.6 mmol/L (ref 3.5–5.1)
Sodium: 139 mmol/L (ref 135–145)

## 2018-08-08 LAB — GLUCOSE, CAPILLARY
Glucose-Capillary: 91 mg/dL (ref 70–99)
Glucose-Capillary: 75 mg/dL (ref 70–99)
Glucose-Capillary: 105 mg/dL — ABNORMAL HIGH (ref 70–99)
Glucose-Capillary: 116 mg/dL — ABNORMAL HIGH (ref 70–99)
Glucose-Capillary: 121 mg/dL — ABNORMAL HIGH (ref 70–99)

## 2018-08-08 MED ORDER — DOCUSATE SODIUM 100 MG PO CAPS
100.0000 mg | ORAL_CAPSULE | Freq: Two times a day (BID) | ORAL | Status: DC
  Administered 2018-08-08 – 2018-08-14 (×12): 100 mg via ORAL
  Filled 2018-08-08 (×13): qty 1

## 2018-08-08 MED ORDER — TRAMADOL HCL 50 MG PO TABS
50.0000 mg | ORAL_TABLET | Freq: Four times a day (QID) | ORAL | Status: DC | PRN
  Administered 2018-08-08 – 2018-08-11 (×3): 50 mg via ORAL
  Filled 2018-08-08 (×3): qty 1

## 2018-08-08 MED ORDER — MORPHINE SULFATE (PF) 2 MG/ML IV SOLN
2.0000 mg | INTRAVENOUS | Status: DC | PRN
  Administered 2018-08-08 (×2): 2 mg via INTRAVENOUS
  Administered 2018-08-09: 4 mg via INTRAVENOUS
  Administered 2018-08-09: 2 mg via INTRAVENOUS
  Administered 2018-08-11: 4 mg via INTRAVENOUS
  Administered 2018-08-11: 2 mg via INTRAVENOUS
  Filled 2018-08-08: qty 2
  Filled 2018-08-08: qty 1
  Filled 2018-08-08: qty 2
  Filled 2018-08-08 (×3): qty 1

## 2018-08-08 NOTE — Plan of Care (Signed)
  Problem: Pain Managment: Goal: General experience of comfort will improve Outcome: Progressing   Problem: Safety: Goal: Ability to remain free from injury will improve Outcome: Progressing   Problem: Skin Integrity: Goal: Risk for impaired skin integrity will decrease Outcome: Progressing   

## 2018-08-08 NOTE — Progress Notes (Signed)
[facility] Progress Note     Subjective: CC: diverticulitis Patient continuing to improve, pain is much more mild at this point. She is tolerating ice chips. Denies nausea. No BM. Ambulated 2x yesterday. Afebrile.   Objective: Vital signs in last 24 hours: Temp:  [97.8 F (36.6 C)-98.7 F (37.1 C)] 98.7 F (37.1 C) (04/22 0419) Pulse Rate:  [60-70] 60 (04/22 0419) Resp:  [16] 16 (04/22 0419) BP: (124-143)/(57-79) 124/61 (04/22 0419) SpO2:  [83 %-98 %] 97 % (04/22 0419) Last BM Date: 08/05/18  Intake/Output from previous day: 04/21 0701 - 04/22 0700 In: 2024.2 [I.V.:1824.2; IV Piggyback:200] Out: -  Intake/Output this shift: No intake/output data recorded.  PE: Gen: Alert, NAD, pleasant Card: Regular rate and rhythm, pedal pulses 2+ BL Pulm: Normal effort, clear to auscultation bilaterally Abd: Soft,mildly TTP in LLQ,no rebound or guarding,non-distended, +BS Skin: warm and dry, no rashes  Psych: A&Ox3   Lab Results:  Recent Labs    08/07/18 0301 08/08/18 0304  WBC 18.0* 15.5*  HGB 13.2 12.3  HCT 42.5 37.8  PLT 316 327   BMET Recent Labs    08/07/18 0301 08/08/18 0304  NA 136 139  K 4.1 3.6  CL 102 106  CO2 18* 20*  GLUCOSE 106* 90  BUN 15 13  CREATININE 0.95 0.73  CALCIUM 8.6* 8.3*   PT/INR No results for input(s): LABPROT, INR in the last 72 hours. CMP     Component Value Date/Time   NA 139 08/08/2018 0304   K 3.6 08/08/2018 0304   CL 106 08/08/2018 0304   CO2 20 (L) 08/08/2018 0304   GLUCOSE 90 08/08/2018 0304   BUN 13 08/08/2018 0304   CREATININE 0.73 08/08/2018 0304   CALCIUM 8.3 (L) 08/08/2018 0304   PROT 8.1 08/01/2018 2037   ALBUMIN 4.3 08/01/2018 2037   AST 19 08/01/2018 2037   ALT 33 08/01/2018 2037   ALKPHOS 79 08/01/2018 2037   BILITOT 1.2 08/01/2018 2037   GFRNONAA >60 08/08/2018 0304   GFRAA >60 08/08/2018 0304   Lipase     Component Value Date/Time   LIPASE 30 08/01/2018 2037        Studies/Results: No results found.  Anti-infectives: Anti-infectives (From admission, onward)   Start     Dose/Rate Route Frequency Ordered Stop   08/07/18 1300  meropenem (MERREM) 1 g in sodium chloride 0.9 % 100 mL IVPB     1 g 200 mL/hr over 30 Minutes Intravenous Every 8 hours 08/07/18 0952     08/05/18 0800  piperacillin-tazobactam (ZOSYN) IVPB 3.375 g  Status:  Discontinued     3.375 g 12.5 mL/hr over 240 Minutes Intravenous Every 8 hours 08/05/18 0508 08/07/18 0951   08/05/18 0130  piperacillin-tazobactam (ZOSYN) IVPB 3.375 g     3.375 g 100 mL/hr over 30 Minutes Intravenous  Once 08/05/18 0123 08/05/18 0211       Assessment/Plan HTN T2DM HLD Hypothyroidism Morbid Obesity - BMI 43 AKI - Cr 0.73, trending down  Acute sigmoid diverticulitis with microperforation and possible developing abscess - WBC 15 today from 18, afebrile - clinically pt continues to improve, advance to CLD - ideally this would resolve without acute surgical intervention, but if patient worsens or fails to improve may need resection - we will continue to follow  - recommend colonoscopy in 6-8 weeks   FEN:CLD, IVF VTE: SCDs, lovenox ID: Zosyn 4/19>4/21; Merrem 4/22>>  LOS: 3 days    Brigid Re , [facility]  08/08/2018, 8:14 AM Pager: [phone] Consults: [phone]

## 2018-08-08 NOTE — Progress Notes (Signed)
PROGRESS NOTE    Kristie Allen  IDP:[id number] DOB: 1955-07-10 DOA: 08/05/2018 PCP: [name]., MD    Brief Narrative:  63 year old female with PMH of DM 2, HTN, HLD, hypothyroid, recent hospitalization 4/43-1/54 for uncomplicated diverticulitis, did well with conservative management and discharged home, returned with worsening LLQ abdominal pain, fever, chills and dry heaves.  Admitted for acute perforated sigmoid diverticulitis with trace pneumoperitoneum and possible developing pelvic abscess.  Managing conservatively.  General surgery on board.  Slowly improving.   Assessment & Plan:   Principal Problem:   Diverticulitis of large intestine with perforation Active Problems:   DM2 (diabetes mellitus, type 2) ([place])   HLD (hyperlipidemia)   HTN (hypertension)   Sigmoid diverticulitis with trace pneumoperitoneum possibly developing pelvic abscess Surgery consulted recommended conservative management with IV fluids, pain management.on IV zosyn,.  Surgery started her on clear liquid diet as her abdominal pain has improved and she does not have any nausea or vomiting.    Type 2 DM:  CBG (last 3)  Recent Labs    08/08/18 0404 08/08/18 0830 08/08/18 1224  GLUCAP 91 75 105*      Hypertension:  Well controlled.   Hypothyroidism:  Resume synthroid.    Morbid obesity/  BMP is 43.      DVT prophylaxis: Lovenox Code Status : full code Family Communication: None at bedside  disposition Plan: Clinical improvement   Consultants:    surgery   Procedures: none   Antimicrobials: IV Zosyn  Subjective: Nausea improved abdominal pain is better.  No vomiting.  Objective: Vitals:   08/07/18 1303 08/07/18 2009 08/08/18 0409 08/08/18 0419  BP: (!) 141/79 (!) 143/57 136/63 124/61  Pulse: 70 67 63 60  Resp: 16 16 16 16   Temp: 97.8 F (36.6 C) 98.7 F (37.1 C) 98.3 F (36.8 C) 98.7 F (37.1 C)  TempSrc: Oral Oral Oral Oral  SpO2: (!) 83% 98% 98% 97%  Weight:       Height:        Intake/Output Summary (Last 24 hours) at 08/08/2018 1314 Last data filed at 08/08/2018 1000 Gross per 24 hour  Intake 2594.18 ml  Output -  Net 2594.18 ml   Filed Weights   08/05/18 0057  Weight: 117.7 kg    Examination:  General exam: Appears calm and comfortable  Respiratory system: Clear to auscultation. Respiratory effort normal. Cardiovascular system: S1 & S2 heard, RRR. No JVD, murmurs, rubs, gallops or clicks. No pedal edema. Gastrointestinal system: Abdomen is nondistended, soft and nontender. No organomegaly or masses felt. Normal bowel sounds heard. Central nervous system: Alert and oriented. No focal neurological deficits. Extremities: Symmetric 5 x 5 power. Skin: No rashes, lesions or ulcers Psychiatry: Judgement and insight appear normal. Mood & affect appropriate.     Data Reviewed: I have personally reviewed following labs and imaging studies  CBC: Recent Labs  Lab 08/03/18 0137 08/05/18 0105 08/06/18 0454 08/07/18 0301 08/08/18 0304  WBC 15.9* 11.5* 15.4* 18.0* 15.5*  NEUTROABS  --  9.7*  --   --   --   HGB 14.0 15.3* 14.0 13.2 12.3  HCT 45.2 49.5* 42.3 42.5 37.8  MCV 83.9 84.8 82.3 83.3 82.7  PLT 255 311 260 316 008   Basic Metabolic Panel: Recent Labs  Lab 08/02/18 0514 08/05/18 0105 08/06/18 0454 08/07/18 0301 08/08/18 0304  NA 134* 133* 135 136 139  K 3.6 3.7 3.7 4.1 3.6  CL 98 97* 101 102 106  CO2 22 21* 18*  18* 20*  GLUCOSE 160* 150* 125* 106* 90  BUN 10 10 10 15 13   CREATININE 0.70 0.69 0.84 0.95 0.73  CALCIUM 8.8* 9.2 8.5* 8.6* 8.3*   GFR: Estimated Creatinine Clearance: 94.8 mL/min (by C-G formula based on SCr of 0.73 mg/dL). Liver Function Tests: Recent Labs  Lab 08/01/18 2037  AST 19  ALT 33  ALKPHOS 79  BILITOT 1.2  PROT 8.1  ALBUMIN 4.3   Recent Labs  Lab 08/01/18 2037  LIPASE 30   No results for input(s): AMMONIA in the last 168 hours. Coagulation Profile: No results for input(s): INR,  PROTIME in the last 168 hours. Cardiac Enzymes: No results for input(s): CKTOTAL, CKMB, CKMBINDEX, TROPONINI in the last 168 hours. BNP (last 3 results) No results for input(s): PROBNP in the last 8760 hours. HbA1C: No results for input(s): HGBA1C in the last 72 hours. CBG: Recent Labs  Lab 08/07/18 2024 08/07/18 2336 08/08/18 0404 08/08/18 0830 08/08/18 1224  GLUCAP 79 83 91 75 105*   Lipid Profile: No results for input(s): CHOL, HDL, LDLCALC, TRIG, CHOLHDL, LDLDIRECT in the last 72 hours. Thyroid Function Tests: No results for input(s): TSH, T4TOTAL, FREET4, T3FREE, THYROIDAB in the last 72 hours. Anemia Panel: No results for input(s): VITAMINB12, FOLATE, FERRITIN, TIBC, IRON, RETICCTPCT in the last 72 hours. Sepsis Labs: Recent Labs  Lab 08/01/18 2037  LATICACIDVEN 1.5    No results found for this or any previous visit (from the past 240 hour(s)).       Radiology Studies: No results found.      Scheduled Meds: . atorvastatin  80 mg Oral Daily  . docusate sodium  100 mg Oral BID  . enoxaparin (LOVENOX) injection  40 mg Subcutaneous Q24H  . ezetimibe  10 mg Oral Daily  . insulin aspart  0-9 Units Subcutaneous Q4H  . levothyroxine  125 mcg Oral Q0600  . mouth rinse  15 mL Mouth Rinse BID  . pantoprazole  40 mg Oral Daily   Continuous Infusions: . sodium chloride 75 mL/hr at 08/08/18 0602  . meropenem (MERREM) IV 1 g (08/08/18 0603)     LOS: 3 days    Time spent: [place], MD [facility] Pager [phone]  If 7PM-7AM, please contact night-coverage [email or web address] Password [email or web address] 08/08/2018, 1:14 PM

## 2018-08-09 LAB — CBC
HCT: 37.6 % (ref 36.0–46.0)
Hemoglobin: 12 g/dL (ref 12.0–15.0)
MCH: 26.5 pg (ref 26.0–34.0)
MCHC: 31.9 g/dL (ref 30.0–36.0)
MCV: 83.2 fL (ref 80.0–100.0)
Platelets: 337 10*3/uL (ref 150–400)
RBC: 4.52 MIL/uL (ref 3.87–5.11)
RDW: 14.3 % (ref 11.5–15.5)
WBC: 13.3 10*3/uL — ABNORMAL HIGH (ref 4.0–10.5)
nRBC: 0 % (ref 0.0–0.2)

## 2018-08-09 LAB — GLUCOSE, CAPILLARY
Glucose-Capillary: 100 mg/dL — ABNORMAL HIGH (ref 70–99)
Glucose-Capillary: 108 mg/dL — ABNORMAL HIGH (ref 70–99)
Glucose-Capillary: 128 mg/dL — ABNORMAL HIGH (ref 70–99)
Glucose-Capillary: 159 mg/dL — ABNORMAL HIGH (ref 70–99)
Glucose-Capillary: 89 mg/dL (ref 70–99)
Glucose-Capillary: 92 mg/dL (ref 70–99)

## 2018-08-09 NOTE — Progress Notes (Signed)
[facility] Progress Note     Subjective: CC: diverticulitis Still dull mild pain in LLQ. Patient got sick after beef broth last night but feels fine this AM. Tolerating other clear liquids. Less flatus and no BM.   Objective: Vital signs in last 24 hours: Temp:  [98.4 F (36.9 C)-98.6 F (37 C)] 98.4 F (36.9 C) (04/23 0401) Pulse Rate:  [54-61] 54 (04/23 0401) Resp:  [14-20] 14 (04/23 0401) BP: (129-153)/(64-79) 130/75 (04/23 0401) SpO2:  [91 %-95 %] 94 % (04/23 0401) Last BM Date: 08/03/18  Intake/Output from previous day: 04/22 0701 - 04/23 0700 In: 1519.2 [P.O.:690; I.V.:629.2; IV Piggyback:200] Out: -  Intake/Output this shift: Total I/O In: 120 [P.O.:120] Out: -   PE: Gen: Alert, NAD, pleasant Card: Regular rate and rhythm, pedal pulses 2+ BL Pulm: Normal effort, clear to auscultation bilaterally Abd: Soft,mildly TTP in LLQ,no rebound or guarding,non-distended, +BS Skin: warm and dry, no rashes  Psych: A&Ox3  Lab Results:  Recent Labs    08/08/18 0304 08/09/18 0433  WBC 15.5* 13.3*  HGB 12.3 12.0  HCT 37.8 37.6  PLT 327 337   BMET Recent Labs    08/07/18 0301 08/08/18 0304  NA 136 139  K 4.1 3.6  CL 102 106  CO2 18* 20*  GLUCOSE 106* 90  BUN 15 13  CREATININE 0.95 0.73  CALCIUM 8.6* 8.3*   PT/INR No results for input(s): LABPROT, INR in the last 72 hours. CMP     Component Value Date/Time   NA 139 08/08/2018 0304   K 3.6 08/08/2018 0304   CL 106 08/08/2018 0304   CO2 20 (L) 08/08/2018 0304   GLUCOSE 90 08/08/2018 0304   BUN 13 08/08/2018 0304   CREATININE 0.73 08/08/2018 0304   CALCIUM 8.3 (L) 08/08/2018 0304   PROT 8.1 08/01/2018 2037   ALBUMIN 4.3 08/01/2018 2037   AST 19 08/01/2018 2037   ALT 33 08/01/2018 2037   ALKPHOS 79 08/01/2018 2037   BILITOT 1.2 08/01/2018 2037   GFRNONAA >60 08/08/2018 0304   GFRAA >60 08/08/2018 0304   Lipase     Component Value Date/Time   LIPASE 30 08/01/2018 2037        Studies/Results: No results found.  Anti-infectives: Anti-infectives (From admission, onward)   Start     Dose/Rate Route Frequency Ordered Stop   08/07/18 1300  meropenem (MERREM) 1 g in sodium chloride 0.9 % 100 mL IVPB     1 g 200 mL/hr over 30 Minutes Intravenous Every 8 hours 08/07/18 0952     08/05/18 0800  piperacillin-tazobactam (ZOSYN) IVPB 3.375 g  Status:  Discontinued     3.375 g 12.5 mL/hr over 240 Minutes Intravenous Every 8 hours 08/05/18 0508 08/07/18 0951   08/05/18 0130  piperacillin-tazobactam (ZOSYN) IVPB 3.375 g     3.375 g 100 mL/hr over 30 Minutes Intravenous  Once 08/05/18 0123 08/05/18 0211       Assessment/Plan HTN T2DM HLD Hypothyroidism Morbid Obesity - BMI 43 AKI - Cr 0.73, trending down  Acute sigmoid diverticulitis with microperforation and possible developing abscess - WBC13.3 trending down, afebrile - clinically pt continues to improve, advance to FLD - ideally this would resolve without acute surgical intervention, but if patient worsens or fails to improve may need resection - we will continue to follow  - recommend colonoscopy in 6-8 weeks   FEN:FLD VTE: SCDs, lovenox ID: Zosyn 4/19>4/21; Merrem 4/22>>  If continuing to improve will discuss transition to PO  abx and soft diet possibly tomorrow. Could potentially be ready for discharge in the next 48 hrs.   LOS: 4 days    Brigid Re , [facility] 08/09/2018, 10:08 AM Pager: [phone] Consults: [phone]

## 2018-08-09 NOTE — Discharge Instructions (Addendum)
Low Fiber Nutrition Therapy (Follow for 6 weeks after discharge)  You may need a low-fiber diet if you have Crohn's disease, diverticulitis, gastroparesis, ulcerative colitis, a new colostomy, or new ileostomy. A low-fiber diet may also be needed following radiation therapy to the pelvis and lower bowel or recent intestinal surgery.  A low-fiber diet reduces the frequency and volume of your stools. This lessens irritation to the gastrointestinal (GI) tract and can help you heal. Use this diet if you have a stricture so your intestine doesn't get blocked. The goal of this diet is to get less than 8 grams of fiber daily. It's also important to eat enough protein foods while you are on a low-fiber diet.  Drink nutrition supplements that have 1 gram of fiber or less in each serving. If your stricture is severe or if your inflammation is severe, drink more liquids to reduce symptoms and to get enough calories and protein.  Tips  Eat about 5 to 6 small meals daily or about every 3 to 4 hours. Do not skip meals.   Every time you eat, include a small amount of protein (1 to 2 ounces) plus an additional food. Low fiber starch foods are the best choice to eat with protein.   Limit acidic, spicy and high-fat or fried and greasy foods to reduce GI symptoms.   Do not eat raw fruits and vegetables while on this diet. All fruits and vegetables need to be cooked and without peels or skins.   Drink a lot of fluids, at least 8 cups of fluid each day. Limit drinks with caffeine, sugar, and sugar substitutes.   Plain water is the best choice. Avoid mixing drink packets or flavor drops into water. .   Take a chewable multivitamin with minerals. Gummy vitamins do not have enough minerals and can block an ostomy and non-chewable supplements are not easily digested. Chewable supplements must be used if you have a stricture or ostomy.   If you are lactose intolerant, you may need to eat low-lactose dairy products. If  you can't tolerate dairy, ask your RDN about how you can get enough calcium from other foods.   Do not take a calcium supplement. They can cause a blockage.   It is important to add high-calcium foods gradually to your diet and monitor for symptoms to avoid a blockage.   Do not add more fiber to your diet until your health care provider or registered dietitian nutritionist (RDN) tells you it's OK. Fiber is part of whole grains, fruits and vegetables (foods from plants) and needs to be slowly added back in to your diet when your body is healed.   Choose foods that have been safely handled and prepared to lower your risk of foodborne illness. Talk to your RDN or see the Food Safety Nutrition Therapy handout for more information.   Foods Recommended These foods are low in fat and fiber and will help with your GI symptoms. Food Group Foods Recommended  Grains  Choose grain foods with less than 2 grams of fiber per serving. Refined white flour products--for example, enriched white bread without seeds, crackers or pasta Cream of wheat or rice Grits (fine ground) Tortillas: white flour or corn White rice, well-cooked (do not rinse, or soak before cooking) Cold and hot cereals made from white or refined flour such as puffed rice or corn flakes  Protein Foods  Lean, very tender, well-cooked poultry or fish; red meats: beef, pork or lamb (slow cook until  soft; chop meats if you have stricture or ostomy) Eggs, well-cooked Smooth nut butters such as almond, peanut, or sunflower Tofu  Dairy  If you have lactose intolerance, drinking milk products from cows or goats may make diarrhea worse. Foods marked with an asterisk (*) have lactose. Milk: fat-free, 1% or 2% * (choose best tolerated) Lactose-free milk Buttermilk* Fortified non-dairy milks: almond, cashew, coconut, or rice (be aware that these options are not good sources of protein so you will need to eat an additional protein food) Kefir* (Don't  include kefir in the diet until approved by your health care provider) Yogurt*/lactose-free yogurt (without nuts, fruit, granola or chocolate) Mild cheese* (hard and aged cheeses tend to be lower in lactose such as cheddar, swiss or parmesan) Cottage cheese* or lactose-free cottage cheese Low-fat ice cream* or lactose-free ice cream Sherbet* (usually lower lactose)  Vegetables  Canned and well-cooked vegetables without seeds, skins, or hulls  Carrots or green beans, cooked White, red or yellow potatoes without skins Strained vegetable juice  Fruit Soft, and well-cooked fruits without skins, seeds, or membranes Canned fruit in juice: peaches, pears, or applesauce Fruit juice without pulp diluted by half with water may be tolerated better Fruit drinks fortified with vitamin C may be tolerated better than 100% fruit juice  Oils  When possible, choose healthy oils and fats, such as olive and canola oils, plant oils rather than solid fats.  Other  Broth and strained soups made from allowed foods Desserts (small portions) without whole grains, seeds, nuts, raisins, or coconut Jelly (clear)   Foods Not Recommended These foods are higher in fat and fiber and may make your GI symptoms worse.  Food Group Foods Not Recommended  Grains  Bread, whole wheat or with whole grain flour or seeds or nuts Brown rice, quinoa, kasha, barley Tortillas: whole grain Whole wheat pasta Whole grain and high-fiber cereals, including oatmeal, bran flakes or shredded wheat Popcorn  Protein Foods  Steak, pork chops, or other meats that are fatty or have gristle Fried meat, poultry, or fish Seafood with a tough or rubbery texture, such as shrimp Luncheon meats such as bologna and salami Sausage, bacon, or hot dogs Dried beans, peas, or lentils Hummus Sushi Nuts and chunky nut butters  Dairy  Whole milk Pea milk and soymilk (may cause diarrhea, gas, bloating, and abdominal pain) Cream Half-and-half Sour  cream Yogurt with added fruit, nuts, or granola or chocolate  Vegetables  Alfalfa or bean sprouts (high fiber and risk for bacteria) Raw or undercooked vegetables: beets; broccoli; brussels sprouts; cabbage; cauliflower; collard, mustard, or turnip greens; corn; cucumber; green peas or any kind of peas; kale; lima beans; mushrooms; okra; olives; pickles and relish; onions; parsnips; peppers; potato skins; sauerkraut; spinach; tomatoes  Fruit Raw fruit Dried fruit Avocado, berries, coconut Canned fruit in syrup Canned fruit with mandarin oranges, papaya or pineapple Fruit juice with pulp Prune juice Fruit skin  Oils  Pork rinds   Low-Fiber (8 grams) Sample 1-Day Menu  Breakfast  cup cream of wheat (0.5 gram fiber)  1 slice white toast (1 gram fiber)  1 teaspoon margarine, soft tub  2 scrambled eggs   Morning Snack 1 cup lactose-free nutrition supplement  Lunch 2 slices white bread (2 grams fiber)  3 tablespoons tuna  1 tablespoon mayonnaise  1 cup chicken noodle soup (1 gram fiber)   cup apple juice   Afternoon Snack 6 saltine crackers (0.5 gram fiber)  2 ounces low-fat cheddar cheese  Evening  Meal 3 ounces tender chicken breast  1 cup white rice (0.5 gram fiber)   cup cooked canned green beans (2 grams fiber)   cup cranberry juice   Evening Snack 1 cup lactose-free nutrition supplement   Copyright 2020  Academy of [employer] and Dietetics  High Fiber Nutrition Therapy (Follow After 6 weeks of discharge) Fiber and fluid may help you feel less constipated and bloated and can also help ease diarrhea. Increase fiber slowly over the course of a few weeks. This will keep your symptoms from getting worse. Tips Tips for Adding Fiber to Your Eating Plan  Slowly increase the amount of fiber you eat to 25 to 35 grams per day.  Eat whole grain breads and cereals. Look for choices with 100% whole wheat, rye, oats, or bran as the first or second ingredient.  Have brown or wild rice  instead of white rice or potatoes.  Enjoy a variety of grains. Good choices include barley, oats, farro, kamut, and quinoa.  Bake with whole wheat flour. You can use it to replace some white or all-purpose flour in recipes.  Enjoy baked beans more often! Add dried beans and peas to casseroles or soups.  Choose fresh fruit and vegetables instead of juices.  Eat fruits and vegetables with peels or skins on.  Compare food labels of similar foods to find higher fiber choices. On packaged foods, the amount of fiber per serving is listed on the Nutrition Facts label.  Check the Nutrition Facts labels and try to choose products with at least 4 g dietary fiber per serving.  Drink plenty of fluids. Set a goal of at least 8 cups per day. You may need even more fluid as you eat higher amounts of fiber. Fluid helps your body process fiber without discomfort. Foods Recommended Foods With at Least 4 g Fiber per Serving Food Group Choose  Grains ?- cup high-fiber cereal  Dried beans and peas  cup cooked red beans, kidney beans, large lima beans, navy beans, pinto beans, white beans, lentils, or black-eyed peas  Vegetables 1 artichoke (cooked)  Fruits  cup blackberries or raspberries 4 dried prunes   Foods With 1 to 3 g Fiber per Serving Food Group Choose  Grains 1 bagel (5.4-MGQQ diameter) 1 slice whole wheat, cracked wheat, pumpernickel, or rye bread 2-inch square cornbread 4 whole wheat crackers 1 bran, blueberry, cornmeal, or English muffin  cup cereal with 1-3 g fiber per serving (check dietary fiber on the product's Nutrition Facts label) 2 tablespoons wheat germ or whole wheat flour  Fruits 1 apple (3-inch diameter) or  cup applesauce  cup apricots (canned) 1 banana  cup cherries (canned or fresh)  cup cranberries (fresh) 3 dates 2 medium figs (fresh)  cup fruit cocktail (canned)  grapefruit 1 kiwi fruit 1 orange (2-inch diameter) 1 peach (fresh) or  cup peaches  (canned) 1 pear (fresh) or  cup pears (canned) 1 plum (2-inch diameter)  cup raisins  cup strawberries (fresh) 1 tangerine  Vegetables  cup bean sprouts (raw)  cup beets (diced, canned)  cup broccoli, brussels sprouts, or cabbage  (cooked)  cup carrots  cup cauliflower  cup corn  cup eggplant  cup okra (boiled)  cup potatoes (baked or mashed)  cup spinach, kale, or turnip greens (cooked)  cup squash--winter, summer, or zucchini (cooked)  cup sweet potatoes or yams  cup tomatoes (canned)  Other 2 tablespoons almonds or peanuts 1 cup popcorn (popped)   High Fiber Vegetarian (Lacto-Ovo) Sample  1-Day Menu  Breakfast  cup bran cereal  1 banana  cup blueberries 1 cup 1% milk  Lunch 2 slices whole wheat bread  2 tablespoons hummus 1 ounce cheddar cheese 1 leaf lettuce 2 slices tomato  cup vegetarian baked beans 1 orange 1 cup 1% milk  Evening Meal Stir fry made with:  cup tempeh   cup brown rice 1 cup frozen broccoli 1 tablespoon soy sauce  cup peanuts  1 pear  Evening Snack 6 ounces fruit yogurt  1 cup air popped popcorn  High Fiber Vegan Sample 1-Day Menu  Breakfast  cup bran cereal  1 banana  cup blueberries 1 cup soymilk fortified with calcium, vitamin B12, and vitamin D  Lunch  cup chili with beans with:   cup tempeh crumbles  cup crushed whole wheat crackers 1 apple 1 cup soymilk fortified with calcium, vitamin B12, and vitamin D  Evening Meal 1 veggie burger  1 whole wheat bun  1 leaf lettuce  1 slice tomato Salad made with: 1 cup lettuce  cup chickpeas   cucumbers 1 tablespoon italian dressing 1 cup strawberries   Evening Snack  cup almonds  1 cup carrot sticks  High Fiber Sample 1-Day Menu  Breakfast 1/2 cup orange juice, with pulp  1/2 cup raisin bran 1 cup fat-free milk 1 cup coffee  Morning Snack 1 cup plain yogurt  2 cups water  Lunch 1 1/2 cups chili  1/2 cup kidney beans 1/2 cup soy crumble 2 tablespoons  shredded cheese 8 whole wheat crackers 1 apple (with skin)   Evening Meal 2 ounces sliced chicken  1/4 cup tofu 2 cups mixed fresh vegetables 1 cup brown rice 1/2 cup strawberries 1 cup hot tea  Evening Snack 2 tablespoons almonds  1 cup hot chocolate   Copyright 2020  Academy of Nutrition and Dietetics    Diverticulitis  Diverticulitis is infection or inflammation of small pouches (diverticula) in the colon that form due to a condition called diverticulosis. Diverticula can trap stool (feces) and bacteria, causing infection and inflammation. Diverticulitis may cause severe stomach pain and diarrhea. It may lead to tissue damage in the colon that causes bleeding. The diverticula may also burst (rupture) and cause infected stool to enter other areas of the abdomen. Complications of diverticulitis can include:  Bleeding.  Severe infection.  Severe pain.  Rupture (perforation) of the colon.  Blockage (obstruction) of the colon. What are the causes? This condition is caused by stool becoming trapped in the diverticula, which allows bacteria to grow in the diverticula. This leads to inflammation and infection. What increases the risk? You are more likely to develop this condition if:  You have diverticulosis. The risk for diverticulosis increases if: ? You are overweight or obese. ? You use tobacco products. ? You do not get enough exercise.  You eat a diet that does not include enough fiber. High-fiber foods include fruits, vegetables, beans, nuts, and whole grains. What are the signs or symptoms? Symptoms of this condition may include:  Pain and tenderness in the abdomen. The pain is normally located on the left side of the abdomen, but it may occur in other areas.  Fever and chills.  Bloating.  Cramping.  Nausea.  Vomiting.  Changes in bowel routines.  Blood in your stool. How is this diagnosed? This condition is diagnosed based on:  Your medical  history.  A physical exam.  Tests to make sure there is nothing else causing your condition. These tests  may include: ? Blood tests. ? Urine tests. ? Imaging tests of the abdomen, including X-rays, ultrasounds, MRIs, or CT scans. How is this treated? Most cases of this condition are mild and can be treated at home. Treatment may include:  Taking over-the-counter pain medicines.  Following a clear liquid diet.  Taking antibiotic medicines by mouth.  Rest. More severe cases may need to be treated at a hospital. Treatment may include:  Not eating or drinking.  Taking prescription pain medicine.  Receiving antibiotic medicines through an IV tube.  Receiving fluids and nutrition through an IV tube.  Surgery. When your condition is under control, your health care provider may recommend that you have a colonoscopy. This is an exam to look at the entire large intestine. During the exam, a lubricated, bendable tube is inserted into the anus and then passed into the rectum, colon, and other parts of the large intestine. A colonoscopy can show how severe your diverticula are and whether something else may be causing your symptoms. Follow these instructions at home: Medicines  Take over-the-counter and prescription medicines only as told by your health care provider. These include fiber supplements, probiotics, and stool softeners.  If you were prescribed an antibiotic medicine, take it as told by your health care provider. Do not stop taking the antibiotic even if you start to feel better.  Do not drive or use heavy machinery while taking prescription pain medicine. General instructions   Follow a full liquid diet or another diet as directed by your health care provider. After your symptoms improve, your health care provider may tell you to change your diet. He or she may recommend that you eat a diet that contains at least 25 g (25 grams) of fiber daily. Fiber makes it easier to pass  stool. Healthy sources of fiber include: ? Berries. One cup contains 4-8 grams of fiber. ? Beans or lentils. One half cup contains 5-8 grams of fiber. ? Green vegetables. One cup contains 4 grams of fiber.  Exercise for at least 30 minutes, 3 times each week. You should exercise hard enough to raise your heart rate and break a sweat.  Keep all follow-up visits as told by your health care provider. This is important. You may need a colonoscopy. Contact a health care provider if:  Your pain does not improve.  You have a hard time drinking or eating food.  Your bowel movements do not return to normal. Get help right away if:  Your pain gets worse.  Your symptoms do not get better with treatment.  Your symptoms suddenly get worse.  You have a fever.  You vomit more than one time.  You have stools that are bloody, black, or tarry. Summary  Diverticulitis is infection or inflammation of small pouches (diverticula) in the colon that form due to a condition called diverticulosis. Diverticula can trap stool (feces) and bacteria, causing infection and inflammation.  You are at higher risk for this condition if you have diverticulosis and you eat a diet that does not include enough fiber.  Most cases of this condition are mild and can be treated at home. More severe cases may need to be treated at a hospital.  When your condition is under control, your health care provider may recommend that you have an exam called a colonoscopy. This exam can show how severe your diverticula are and whether something else may be causing your symptoms. This information is not intended to replace advice given to you  by your health care provider. Make sure you discuss any questions you have with your health care provider. Document Released: 01/12/2005 Document Revised: 05/07/2016 Document Reviewed: 05/07/2016 Elsevier Interactive Patient Education  2019 [employer].

## 2018-08-09 NOTE — Progress Notes (Signed)
PROGRESS NOTE    Kristie Allen  IHK:[id number] DOB: [birth year] DOA: 08/05/2018 PCP: [name]., MD    Brief Narrative:  63 year old female with PMH of DM 2, HTN, HLD, hypothyroid, recent hospitalization 7/56-4/33 for uncomplicated diverticulitis, did well with conservative management and discharged home, returned with worsening LLQ abdominal pain, fever, chills and dry heaves.  Admitted for acute perforated sigmoid diverticulitis with trace pneumoperitoneum and possible developing pelvic abscess.  Managing conservatively.  General surgery on board.  Slowly improving.   Assessment & Plan:   Principal Problem:   Diverticulitis of large intestine with perforation Active Problems:   DM2 (diabetes mellitus, type 2) ([place])   HLD (hyperlipidemia)   HTN (hypertension)   Sigmoid diverticulitis with trace pneumoperitoneum possibly developing pelvic abscess Surgery consulted recommended conservative management with IV fluids, pain management.on IV zosyn,.  Surgery started her on clear liquid diet as her abdominal pain has improved and she does not have any nausea or vomiting. Advanced to full liquid diet today and probably soft diet tomorrow.     Type 2 DM:  CBG (last 3)  Recent Labs    08/09/18 0403 08/09/18 0742 08/09/18 1157  GLUCAP 100* 92 89  continue with SSI while hospitalized.     Hypertension:  Well controlled.   Hypothyroidism:  Resume synthroid.    Morbid obesity/  BMP is 43.      DVT prophylaxis: Lovenox Code Status : full code Family Communication: None at bedside  disposition Plan:  Pending Clinical improvement   Consultants:    surgery   Procedures: none   Antimicrobials: IV Zosyn  Subjective: abd pain in the lower quadrant today, no BM yet.   Objective: Vitals:   08/08/18 1403 08/08/18 1954 08/09/18 0401 08/09/18 1252  BP: 129/64 (!) 153/79 130/75 133/69  Pulse: 61 (!) 59 (!) 54 64  Resp: 20 16 14    Temp: 98.6 F (37 C) 98.4 F  (36.9 C) 98.4 F (36.9 C) 98.1 F (36.7 C)  TempSrc: Oral Oral Oral Oral  SpO2: 95% 91% 94% 99%  Weight:      Height:        Intake/Output Summary (Last 24 hours) at 08/09/2018 1326 Last data filed at 08/09/2018 0915 Gross per 24 hour  Intake 1069.16 ml  Output -  Net 1069.16 ml   Filed Weights   08/05/18 0057  Weight: 117.7 kg    Examination:  General exam: Appears calm and comfortable  Respiratory system: Clear to auscultation. Respiratory effort normal. Cardiovascular system: S1 & S2 heard, RRR. No JVD,  No pedal edema. Gastrointestinal system: Abdomen is mild ly tender in the lower quadrant.  No organomegaly or masses felt. Normal bowel sounds heard. Central nervous system: Alert and oriented. No focal neurological deficits. Extremities: Symmetric 5 x 5 power. Skin: No rashes, lesions or ulcers Psychiatry:  Mood & affect appropriate.     Data Reviewed: I have personally reviewed following labs and imaging studies  CBC: Recent Labs  Lab 08/05/18 0105 08/06/18 0454 08/07/18 0301 08/08/18 0304 08/09/18 0433  WBC 11.5* 15.4* 18.0* 15.5* 13.3*  NEUTROABS 9.7*  --   --   --   --   HGB 15.3* 14.0 13.2 12.3 12.0  HCT 49.5* 42.3 42.5 37.8 37.6  MCV 84.8 82.3 83.3 82.7 83.2  PLT 311 260 316 327 295   Basic Metabolic Panel: Recent Labs  Lab 08/05/18 0105 08/06/18 0454 08/07/18 0301 08/08/18 0304  NA 133* 135 136 139  K 3.7 3.7 4.1  3.6  CL 97* 101 102 106  CO2 21* 18* 18* 20*  GLUCOSE 150* 125* 106* 90  BUN 10 10 15 13   CREATININE 0.69 0.84 0.95 0.73  CALCIUM 9.2 8.5* 8.6* 8.3*   GFR: Estimated Creatinine Clearance: 94.8 mL/min (by C-G formula based on SCr of 0.73 mg/dL). Liver Function Tests: No results for input(s): AST, ALT, ALKPHOS, BILITOT, PROT, ALBUMIN in the last 168 hours. No results for input(s): LIPASE, AMYLASE in the last 168 hours. No results for input(s): AMMONIA in the last 168 hours. Coagulation Profile: No results for input(s): INR,  PROTIME in the last 168 hours. Cardiac Enzymes: No results for input(s): CKTOTAL, CKMB, CKMBINDEX, TROPONINI in the last 168 hours. BNP (last 3 results) No results for input(s): PROBNP in the last 8760 hours. HbA1C: No results for input(s): HGBA1C in the last 72 hours. CBG: Recent Labs  Lab 08/08/18 1956 08/09/18 0032 08/09/18 0403 08/09/18 0742 08/09/18 1157  GLUCAP 121* 108* 100* 92 89   Lipid Profile: No results for input(s): CHOL, HDL, LDLCALC, TRIG, CHOLHDL, LDLDIRECT in the last 72 hours. Thyroid Function Tests: No results for input(s): TSH, T4TOTAL, FREET4, T3FREE, THYROIDAB in the last 72 hours. Anemia Panel: No results for input(s): VITAMINB12, FOLATE, FERRITIN, TIBC, IRON, RETICCTPCT in the last 72 hours. Sepsis Labs: No results for input(s): PROCALCITON, LATICACIDVEN in the last 168 hours.  No results found for this or any previous visit (from the past 240 hour(s)).       Radiology Studies: No results found.      Scheduled Meds: . atorvastatin  80 mg Oral Daily  . docusate sodium  100 mg Oral BID  . enoxaparin (LOVENOX) injection  40 mg Subcutaneous Q24H  . ezetimibe  10 mg Oral Daily  . insulin aspart  0-9 Units Subcutaneous Q4H  . levothyroxine  125 mcg Oral Q0600  . mouth rinse  15 mL Mouth Rinse BID  . pantoprazole  40 mg Oral Daily   Continuous Infusions: . sodium chloride 75 mL/hr at 08/08/18 0602  . meropenem (MERREM) IV 1 g (08/09/18 1224)     LOS: 4 days    Time spent: 28 minutes.     Hosie Poisson, MD [facility] Pager [phone]  If 7PM-7AM, please contact night-coverage [email or web address] Password [email or web address] 08/09/2018, 1:26 PM

## 2018-08-09 NOTE — Plan of Care (Signed)
Nutrition Education Note  RD working remotely.   RD consulted for nutrition education regarding nutrition management for diverticulosis/ Diverticulitis.   Spoke with pt via phone, who reports feeling better today. She consumed some grits without difficulty this morning. PTA she had a good appetite and consumed 2 meals per day. She denies any weight loss. She verbalized possible plan to advance diet to soft tomorrow and likely discharge within the next 24-48 hours.   RD provided "Fiber Restricted Nutrition Therapy" and "High Fiber Nutrition Therapy" handouts from the Academy of [employer]. Handouts were included on pt's After Visit Summary and will be printed out as part of her discharge instructions and pt is aware and agreeable to this plan. Reviewed patient's dietary recall and discussed ways for pt to meet nutrition goals over the next several weeks. Explained reasons for pt to follow a low fiber diet over the next 6 weeks. Reviewed low fiber foods and high fiber foods. Discussed best practice for long term management of diverticulosis is a high fiber diet and discussed ways to gradually increase fiber in the diet.   Teach back method used. Pt verbalizes understanding of information provided.   Expect fair to good compliance.  Body mass index is Body mass index is 43.18 kg/m.Marland Kitchen Pt meets criteria for obesity, class III based on current BMI.  Current diet order is full liquid, patient is consuming approximately 50% of meals at this time. Labs and medications reviewed. No further nutrition interventions warranted at this time. RD contact information provided. If additional nutrition issues arise, please re-consult RD.  Zyonna Vardaman A. Jimmye Norman, RD, LDN, [place] Registered Dietitian II Certified Diabetes Care and Education Specialist Pager: [phone] After hours Pager: [phone]

## 2018-08-09 NOTE — Progress Notes (Signed)
Patient educated about eating as tolerated. Patient is on a FLD and eating ice cream. She states she does not have much of an appetite. Advised patient that this is not uncommon and she can take her time introducing foods to her intake. Patient verbalizes understanding.

## 2018-08-10 ENCOUNTER — Inpatient Hospital Stay (HOSPITAL_COMMUNITY): Payer: PRIVATE HEALTH INSURANCE

## 2018-08-10 LAB — GLUCOSE, CAPILLARY
Glucose-Capillary: 105 mg/dL — ABNORMAL HIGH (ref 70–99)
Glucose-Capillary: 105 mg/dL — ABNORMAL HIGH (ref 70–99)
Glucose-Capillary: 111 mg/dL — ABNORMAL HIGH (ref 70–99)
Glucose-Capillary: 126 mg/dL — ABNORMAL HIGH (ref 70–99)
Glucose-Capillary: 97 mg/dL (ref 70–99)
Glucose-Capillary: 97 mg/dL (ref 70–99)

## 2018-08-10 LAB — BASIC METABOLIC PANEL
Anion gap: 10 (ref 5–15)
BUN: 7 mg/dL — ABNORMAL LOW (ref 8–23)
CO2: 23 mmol/L (ref 22–32)
Calcium: 8.4 mg/dL — ABNORMAL LOW (ref 8.9–10.3)
Chloride: 108 mmol/L (ref 98–111)
Creatinine, Ser: 0.46 mg/dL (ref 0.44–1.00)
GFR calc Af Amer: >60 mL/min (ref >60)
GFR calc non Af Amer: >60 mL/min (ref >60)
Glucose, Bld: 122 mg/dL — ABNORMAL HIGH (ref 70–99)
Potassium: 3.8 mmol/L (ref 3.5–5.1)
Sodium: 141 mmol/L (ref 135–145)

## 2018-08-10 LAB — CBC
HCT: 36.6 % (ref 36.0–46.0)
Hemoglobin: 11.9 g/dL — ABNORMAL LOW (ref 12.0–15.0)
MCH: 27 pg (ref 26.0–34.0)
MCHC: 32.5 g/dL (ref 30.0–36.0)
MCV: 83.2 fL (ref 80.0–100.0)
Platelets: 336 10*3/uL (ref 150–400)
RBC: 4.4 MIL/uL (ref 3.87–5.11)
RDW: 14.4 % (ref 11.5–15.5)
WBC: 14 10*3/uL — ABNORMAL HIGH (ref 4.0–10.5)
nRBC: 0 % (ref 0.0–0.2)

## 2018-08-10 MED ORDER — IOHEXOL 300 MG/ML  SOLN
100.0000 mL | Freq: Once | INTRAMUSCULAR | Status: AC | PRN
  Administered 2018-08-10: 19:00:00 100 mL via INTRAVENOUS

## 2018-08-10 NOTE — Progress Notes (Signed)
Pharmacy Antibiotic Note  Kristie Allen is a 63 y.o. female admitted on 08/05/2018 with intra abdominal infection.  Pharmacy has been consulted to dose Merrem. Patient remains afebrile. Patient was recently in hospital. No surgery or steroids. No cultures. SCr 0.95 with CrCl ~80 mL/min.   WBC up slightly to 14. Renal function stable. MD considering repeat CT.  Plan: Continue Merrem 1g IV every 8 hours. Monitor leukocytosis, clinical status, and renal function.   Height: 5\' 5"  (165.1 cm) Weight: 259 lb 7.7 oz (117.7 kg) IBW/kg (Calculated) : 57  Temp (24hrs), Avg:98.2 F (36.8 C), Min:98.1 F (36.7 C), Max:98.3 F (36.8 C)  Recent Labs  Lab 08/05/18 0105 08/06/18 0454 08/07/18 0301 08/08/18 0304 08/09/18 0433 08/10/18 0158 08/10/18 0941  WBC 11.5* 15.4* 18.0* 15.5* 13.3* 14.0*  --   CREATININE 0.69 0.84 0.95 0.73  --   --  0.46    Estimated Creatinine Clearance: 94.8 mL/min (by C-G formula based on SCr of 0.46 mg/dL).    Allergies  Allergen Reactions  . Bisphosphonates Other (See Comments)    Flu symptoms    Thank you for allowing pharmacy to be a part of this patient's care.  Renold Genta, PharmD, BCPS Clinical Pharmacist Clinical phone for 08/10/2018 until 3p is [phone] 08/10/2018 11:21 AM  **Pharmacist phone directory can now be found on [email or web address] listed under [place]**

## 2018-08-10 NOTE — Progress Notes (Signed)
PROGRESS NOTE    Kristie Allen  GEZ:[id number] DOB: July 05, 1955 DOA: 08/05/2018 PCP: [name]., MD    Brief Narrative:  63 year old female with PMH of DM 2, HTN, HLD, hypothyroid, recent hospitalization 6/50-3/54 for uncomplicated diverticulitis, did well with conservative management and discharged home, returned with worsening LLQ abdominal pain, fever, chills and dry heaves.  Admitted for acute perforated sigmoid diverticulitis with trace pneumoperitoneum and possible developing pelvic abscess.  Managing conservatively.  General surgery on board.  Slowly improving.   Assessment & Plan:   Principal Problem:   Diverticulitis of large intestine with perforation Active Problems:   DM2 (diabetes mellitus, type 2) ([place])   HLD (hyperlipidemia)   HTN (hypertension)   Sigmoid diverticulitis with trace pneumoperitoneum possibly developing pelvic abscess Surgery consulted recommended conservative management with IV fluids, pain management.on IV zosyn,.  Surgery started her on clear liquid diet as her abdominal pain has improved and she does not have any nausea or vomiting. Advanced to full liquid diet . She reports having pain last night and this morning in the lower quadrant, more in the left lower quadrant with some nausea . She remains afebrile and her wbc count is slightly increased from 13,000 to 14,000.  Surgery recommends to stay on full liquid diet for another 24 hours and monitor.     Type 2 DM:  CBG (last 3)  Recent Labs    08/10/18 0048 08/10/18 0413 08/10/18 0745  GLUCAP 105* 97 105*  continue with SSI while hospitalized.     Hypertension:  Well controlled.   Hypothyroidism:  Resume synthroid.    Morbid obesity/  BMP is 43.      DVT prophylaxis: Lovenox Code Status : full code Family Communication: None at bedside  disposition Plan:  Pending Clinical improvement   Consultants:    surgery   Procedures: none   Antimicrobials: IV Zosyn   Subjective: abd pain in the lower quadrant today, no BM yet.  Has some nausea with abd pain , no vomiting .   Objective: Vitals:   08/09/18 0401 08/09/18 1252 08/09/18 2033 08/10/18 0417  BP: 130/75 133/69 (!) 159/78 138/74  Pulse: (!) 54 64 (!) 57 (!) 54  Resp: 14  16 14   Temp: 98.4 F (36.9 C) 98.1 F (36.7 C) 98.3 F (36.8 C) 98.2 F (36.8 C)  TempSrc: Oral Oral Oral Oral  SpO2: 94% 99% 94% 94%  Weight:      Height:        Intake/Output Summary (Last 24 hours) at 08/10/2018 1101 Last data filed at 08/10/2018 0900 Gross per 24 hour  Intake 940 ml  Output -  Net 940 ml   Filed Weights   08/05/18 0057  Weight: 117.7 kg    Examination:  General exam: Appears calm and comfortable  Respiratory system: Clear to auscultation. Respiratory effort normal. Cardiovascular system: S1 & S2 heard, RRR. No JVD,  No pedal edema. Gastrointestinal system: Abdomen is mildly tender in the lower quadrant.  No organomegaly or masses felt. Normal bowel sounds heard. Central nervous system: Alert and oriented. No focal neurological deficits. Extremities: Symmetric 5 x 5 power. Skin: No rashes, lesions or ulcers Psychiatry:  Mood & affect appropriate.     Data Reviewed: I have personally reviewed following labs and imaging studies  CBC: Recent Labs  Lab 08/05/18 0105 08/06/18 0454 08/07/18 0301 08/08/18 0304 08/09/18 0433 08/10/18 0158  WBC 11.5* 15.4* 18.0* 15.5* 13.3* 14.0*  NEUTROABS 9.7*  --   --   --   --   --  HGB 15.3* 14.0 13.2 12.3 12.0 11.9*  HCT 49.5* 42.3 42.5 37.8 37.6 36.6  MCV 84.8 82.3 83.3 82.7 83.2 83.2  PLT 311 260 316 327 337 637   Basic Metabolic Panel: Recent Labs  Lab 08/05/18 0105 08/06/18 0454 08/07/18 0301 08/08/18 0304 08/10/18 0941  NA 133* 135 136 139 141  K 3.7 3.7 4.1 3.6 3.8  CL 97* 101 102 106 108  CO2 21* 18* 18* 20* 23  GLUCOSE 150* 125* 106* 90 122*  BUN 10 10 15 13  7*  CREATININE 0.69 0.84 0.95 0.73 0.46  CALCIUM 9.2 8.5*  8.6* 8.3* 8.4*   GFR: Estimated Creatinine Clearance: 94.8 mL/min (by C-G formula based on SCr of 0.46 mg/dL). Liver Function Tests: No results for input(s): AST, ALT, ALKPHOS, BILITOT, PROT, ALBUMIN in the last 168 hours. No results for input(s): LIPASE, AMYLASE in the last 168 hours. No results for input(s): AMMONIA in the last 168 hours. Coagulation Profile: No results for input(s): INR, PROTIME in the last 168 hours. Cardiac Enzymes: No results for input(s): CKTOTAL, CKMB, CKMBINDEX, TROPONINI in the last 168 hours. BNP (last 3 results) No results for input(s): PROBNP in the last 8760 hours. HbA1C: No results for input(s): HGBA1C in the last 72 hours. CBG: Recent Labs  Lab 08/09/18 1602 08/09/18 1952 08/10/18 0048 08/10/18 0413 08/10/18 0745  GLUCAP 159* 128* 105* 97 105*   Lipid Profile: No results for input(s): CHOL, HDL, LDLCALC, TRIG, CHOLHDL, LDLDIRECT in the last 72 hours. Thyroid Function Tests: No results for input(s): TSH, T4TOTAL, FREET4, T3FREE, THYROIDAB in the last 72 hours. Anemia Panel: No results for input(s): VITAMINB12, FOLATE, FERRITIN, TIBC, IRON, RETICCTPCT in the last 72 hours. Sepsis Labs: No results for input(s): PROCALCITON, LATICACIDVEN in the last 168 hours.  No results found for this or any previous visit (from the past 240 hour(s)).       Radiology Studies: No results found.      Scheduled Meds: . atorvastatin  80 mg Oral Daily  . docusate sodium  100 mg Oral BID  . enoxaparin (LOVENOX) injection  40 mg Subcutaneous Q24H  . ezetimibe  10 mg Oral Daily  . insulin aspart  0-9 Units Subcutaneous Q4H  . levothyroxine  125 mcg Oral Q0600  . mouth rinse  15 mL Mouth Rinse BID  . pantoprazole  40 mg Oral Daily   Continuous Infusions: . sodium chloride 75 mL/hr at 08/09/18 1416  . meropenem (MERREM) IV 1 g (08/10/18 0617)     LOS: 5 days    Time spent: 28 minutes.     Hosie Poisson, MD [facility] Pager [phone]   If 7PM-7AM, please contact night-coverage [email or web address] Password [email or web address] 08/10/2018, 11:01 AM

## 2018-08-10 NOTE — Progress Notes (Addendum)
[facility] Progress Note     Subjective: CC: diverticulitis Patient reports more pain overnight but did pass some flatus which seemed to help. Pain in LLQ and radiating to back. She reports tolerating CLD and denies nausea.  Objective: Vital signs in last 24 hours: Temp:  [98.1 F (36.7 C)-98.3 F (36.8 C)] 98.2 F (36.8 C) (04/24 0417) Pulse Rate:  [54-64] 54 (04/24 0417) Resp:  [14-16] 14 (04/24 0417) BP: (133-159)/(69-78) 138/74 (04/24 0417) SpO2:  [94 %-99 %] 94 % (04/24 0417) Last BM Date: 08/05/18  Intake/Output from previous day: 04/23 0701 - 04/24 0700 In: 820 [P.O.:120; I.V.:600; IV Piggyback:100] Out: -  Intake/Output this shift: Total I/O In: 240 [P.O.:240] Out: -   PE: Gen: Alert, NAD, pleasant Card: Regular rate and rhythm, pedal pulses 2+ BL Pulm: Normal effort, clear to auscultation bilaterally Abd: Soft,mildly TTP in LLQ,no rebound or guarding,non-distended, +BS Skin: warm and dry, no rashes  Psych: A&Ox3   Lab Results:  Recent Labs    08/09/18 0433 08/10/18 0158  WBC 13.3* 14.0*  HGB 12.0 11.9*  HCT 37.6 36.6  PLT 337 336   BMET Recent Labs    08/08/18 0304  NA 139  K 3.6  CL 106  CO2 20*  GLUCOSE 90  BUN 13  CREATININE 0.73  CALCIUM 8.3*   PT/INR No results for input(s): LABPROT, INR in the last 72 hours. CMP     Component Value Date/Time   NA 139 08/08/2018 0304   K 3.6 08/08/2018 0304   CL 106 08/08/2018 0304   CO2 20 (L) 08/08/2018 0304   GLUCOSE 90 08/08/2018 0304   BUN 13 08/08/2018 0304   CREATININE 0.73 08/08/2018 0304   CALCIUM 8.3 (L) 08/08/2018 0304   PROT 8.1 08/01/2018 2037   ALBUMIN 4.3 08/01/2018 2037   AST 19 08/01/2018 2037   ALT 33 08/01/2018 2037   ALKPHOS 79 08/01/2018 2037   BILITOT 1.2 08/01/2018 2037   GFRNONAA >60 08/08/2018 0304   GFRAA >60 08/08/2018 0304   Lipase     Component Value Date/Time   LIPASE 30 08/01/2018 2037       Studies/Results: No results  found.  Anti-infectives: Anti-infectives (From admission, onward)   Start     Dose/Rate Route Frequency Ordered Stop   08/07/18 1300  meropenem (MERREM) 1 g in sodium chloride 0.9 % 100 mL IVPB     1 g 200 mL/hr over 30 Minutes Intravenous Every 8 hours 08/07/18 0952     08/05/18 0800  piperacillin-tazobactam (ZOSYN) IVPB 3.375 g  Status:  Discontinued     3.375 g 12.5 mL/hr over 240 Minutes Intravenous Every 8 hours 08/05/18 0508 08/07/18 0951   08/05/18 0130  piperacillin-tazobactam (ZOSYN) IVPB 3.375 g     3.375 g 100 mL/hr over 30 Minutes Intravenous  Once 08/05/18 0123 08/05/18 0211       Assessment/Plan HTN T2DM HLD Hypothyroidism Morbid Obesity - BMI 43 AKI - improving   Acute sigmoid diverticulitis with microperforation and possible developing abscess - WBC14, afebrile - keep on FLD with pain and increased leukocytosis - considering repeat CT - will discuss with MD - ideally this would resolve without acute surgical intervention, but if patient worsens or fails to improve may need resection - we will continue to follow  - recommend colonoscopy in 6-8 weeks   FEN:FLD VTE: SCDs, lovenox ID: Zosyn 4/19>4/21; Merrem 4/22>>  Patient's daughter Herma Mering on speaker phone while I was present and seeing patient  this AM.  LOS: 5 days    Brigid Re , [facility] 08/10/2018, 10:06 AM Pager: [phone] Consults: [phone]

## 2018-08-11 LAB — GLUCOSE, CAPILLARY
Glucose-Capillary: 101 mg/dL — ABNORMAL HIGH (ref 70–99)
Glucose-Capillary: 102 mg/dL — ABNORMAL HIGH (ref 70–99)
Glucose-Capillary: 104 mg/dL — ABNORMAL HIGH (ref 70–99)
Glucose-Capillary: 105 mg/dL — ABNORMAL HIGH (ref 70–99)
Glucose-Capillary: 84 mg/dL (ref 70–99)
Glucose-Capillary: 91 mg/dL (ref 70–99)
Glucose-Capillary: 94 mg/dL (ref 70–99)

## 2018-08-11 LAB — CBC
HCT: 37.6 % (ref 36.0–46.0)
Hemoglobin: 12.3 g/dL (ref 12.0–15.0)
MCH: 26.9 pg (ref 26.0–34.0)
MCHC: 32.7 g/dL (ref 30.0–36.0)
MCV: 82.1 fL (ref 80.0–100.0)
Platelets: 376 10*3/uL (ref 150–400)
RBC: 4.58 MIL/uL (ref 3.87–5.11)
RDW: 14.3 % (ref 11.5–15.5)
WBC: 15.3 10*3/uL — ABNORMAL HIGH (ref 4.0–10.5)
nRBC: 0 % (ref 0.0–0.2)

## 2018-08-11 LAB — BASIC METABOLIC PANEL
Anion gap: 9 (ref 5–15)
BUN: 6 mg/dL — ABNORMAL LOW (ref 8–23)
CO2: 24 mmol/L (ref 22–32)
Calcium: 8.4 mg/dL — ABNORMAL LOW (ref 8.9–10.3)
Chloride: 107 mmol/L (ref 98–111)
Creatinine, Ser: 0.5 mg/dL (ref 0.44–1.00)
GFR calc Af Amer: >60 mL/min (ref >60)
GFR calc non Af Amer: >60 mL/min (ref >60)
Glucose, Bld: 113 mg/dL — ABNORMAL HIGH (ref 70–99)
Potassium: 3.7 mmol/L (ref 3.5–5.1)
Sodium: 140 mmol/L (ref 135–145)

## 2018-08-11 LAB — PROTIME-INR
INR: 1.1 (ref 0.8–1.2)
Prothrombin Time: 14.4 seconds (ref 11.4–15.2)

## 2018-08-11 MED ORDER — ENOXAPARIN SODIUM 40 MG/0.4ML ~~LOC~~ SOLN
40.0000 mg | SUBCUTANEOUS | Status: DC
  Filled 2018-08-11 (×2): qty 0.4

## 2018-08-11 NOTE — Progress Notes (Signed)
Request to IR for abdominal abscess aspiration/drain placement - imaging has been reviewed by Dr. Kathlene Cote who states that abscess is very deep with possibly no window due to overlying bowel, however we will bring to CT for further assessment and possible drain placement if safe window is found.  Due to emergent procedures today in IR this will tentatively be scheduled for AM of 4/26.  I will place orders for NPO after midnight, hold AM lovenox. Full consult/consent will be done when patient arrives for procedure.  Please call IR with questions or concerns.  Candiss Norse, PA-C Pager# [phone]

## 2018-08-11 NOTE — Progress Notes (Signed)
[facility] Progress Note     Subjective: CC: diverticulitis Discussed CT results with patient and plan for possible drainage. Patient reports some worsened pain in L flank overnight but this is currently improved. Denies nausea. Tolerating liquids.   Objective: Vital signs in last 24 hours: Temp:  [97.9 F (36.6 C)-98.4 F (36.9 C)] 97.9 F (36.6 C) (04/25 0408) Pulse Rate:  [54-57] 54 (04/25 0408) Resp:  [18-20] 18 (04/24 2020) BP: (146-153)/(66-80) 147/66 (04/25 0408) SpO2:  [95 %-98 %] 95 % (04/25 0408) Last BM Date: 08/05/18  Intake/Output from previous day: 04/24 0701 - 04/25 0700 In: 720 [P.O.:720] Out: -  Intake/Output this shift: No intake/output data recorded.  PE: Gen: Alert, NAD, pleasant Card: Regular rate and rhythm, pedal pulses 2+ BL Pulm: Normal effort, clear to auscultation bilaterally Abd: Soft,mildly TTP in LLQ,no rebound or guarding,non-distended, +BS Skin: warm and dry, no rashes  Psych: A&Ox3  Lab Results:  Recent Labs    08/10/18 0158 08/11/18 0250  WBC 14.0* 15.3*  HGB 11.9* 12.3  HCT 36.6 37.6  PLT 336 376   BMET Recent Labs    08/10/18 0941 08/11/18 0250  NA 141 140  K 3.8 3.7  CL 108 107  CO2 23 24  GLUCOSE 122* 113*  BUN 7* 6*  CREATININE 0.46 0.50  CALCIUM 8.4* 8.4*   PT/INR No results for input(s): LABPROT, INR in the last 72 hours. CMP     Component Value Date/Time   NA 140 08/11/2018 0250   K 3.7 08/11/2018 0250   CL 107 08/11/2018 0250   CO2 24 08/11/2018 0250   GLUCOSE 113 (H) 08/11/2018 0250   BUN 6 (L) 08/11/2018 0250   CREATININE 0.50 08/11/2018 0250   CALCIUM 8.4 (L) 08/11/2018 0250   PROT 8.1 08/01/2018 2037   ALBUMIN 4.3 08/01/2018 2037   AST 19 08/01/2018 2037   ALT 33 08/01/2018 2037   ALKPHOS 79 08/01/2018 2037   BILITOT 1.2 08/01/2018 2037   GFRNONAA >60 08/11/2018 0250   GFRAA >60 08/11/2018 0250   Lipase     Component Value Date/Time   LIPASE 30 08/01/2018 2037        Studies/Results: Ct Abdomen Pelvis W Contrast  Result Date: 08/10/2018 CLINICAL DATA:  Abdominal infection EXAM: CT ABDOMEN AND PELVIS WITH CONTRAST TECHNIQUE: Multidetector CT imaging of the abdomen and pelvis was performed using the standard protocol following bolus administration of intravenous contrast. CONTRAST:  161mL OMNIPAQUE IOHEXOL 300 MG/ML  SOLN COMPARISON:  None. FINDINGS: Lower chest: Small right pleural effusion. Bibasilar atelectasis. Heart is normal size. No focal hepatic abnormality.  Gallbladder unremarkable. Hepatobiliary: No focal hepatic abnormality. Gallbladder unremarkable. Pancreas: No focal abnormality or ductal dilatation. Spleen: No focal abnormality.  Normal size. Adrenals/Urinary Tract: mild left hydronephrosis, new since prior study. No hydronephrosis on the right. Adrenal glands and urinary bladder unremarkable. No focal renal abnormality. Stomach/Bowel: Sigmoid diverticulosis. Inflammatory stranding noted with fluid collection again noted anterior to the left psoas muscle with air-fluid level. This measures 4.5 x 3.7 cm compared with 2.6 cm maximally previously. No evidence of bowel obstruction. Stomach and small bowel decompressed, grossly unremarkable. Vascular/Lymphatic: Scattered calcifications in the aorta. No evidence of aneurysm or adenopathy. Reproductive: Uterus and adnexa unremarkable.  No mass. Other: Small amount of free fluid in the pelvis. A few locules of extraluminal gas again noted adjacent to the sigmoid colon, similar to prior study. Musculoskeletal: No acute bony abnormality. IMPRESSION: Changes of sigmoid diverticulitis. Few locules of extraluminal gas  and stranding in the left lower quadrant. Enlarging abscess in the left lower abdomen anterior to the left psoas muscle, now measuring up to 4.5 cm compared with 2.6 cm previously. This appears to be the cause of mild left hydronephrosis which is new since prior study, likely due to mass effect on  the left ureter. Small amount of free fluid in the pelvis. Small right pleural effusion.  Bibasilar atelectasis. Electronically Signed   By: Rolm Baptise M.D.   On: 08/10/2018 21:21    Anti-infectives: Anti-infectives (From admission, onward)   Start     Dose/Rate Route Frequency Ordered Stop   08/07/18 1300  meropenem (MERREM) 1 g in sodium chloride 0.9 % 100 mL IVPB     1 g 200 mL/hr over 30 Minutes Intravenous Every 8 hours 08/07/18 0952     08/05/18 0800  piperacillin-tazobactam (ZOSYN) IVPB 3.375 g  Status:  Discontinued     3.375 g 12.5 mL/hr over 240 Minutes Intravenous Every 8 hours 08/05/18 0508 08/07/18 0951   08/05/18 0130  piperacillin-tazobactam (ZOSYN) IVPB 3.375 g     3.375 g 100 mL/hr over 30 Minutes Intravenous  Once 08/05/18 0123 08/05/18 0211       Assessment/Plan HTN T2DM HLD Hypothyroidism Morbid Obesity - BMI 43 AKI - improving   Acute sigmoid diverticulitis with microperforation and possible developing abscess - WBC15, afebrile - CLD as tolerated today and NPO after MN  - CT yesterday showed enlarging L pelvic abscess anterior to L psoas muscle - IR consulted and plan to attempt drain placement tomorrow AM - ideally this would resolve without acute surgical intervention, but if patient worsens or fails to improve may need resection - we will continue to follow  - recommend colonoscopy in 6-8 weeks   FEN:CLD VTE: SCDs, lovenox ID: Zosyn 4/19>4/21; Merrem 4/22>>   LOS: 6 days    Brigid Re , [facility] 08/11/2018, 9:02 AM Pager: [phone] Consults: [phone]

## 2018-08-11 NOTE — Progress Notes (Signed)
PROGRESS NOTE    Kristie Allen  KYH:[id number] DOB: [birth year] DOA: 08/05/2018 PCP: [name]., MD    Brief Narrative:  63 year old female with PMH of DM 2, HTN, HLD, hypothyroid, recent hospitalization 1/51-7/61 for uncomplicated diverticulitis, did well with conservative management and discharged home, returned with worsening LLQ abdominal pain, fever, chills and dry heaves.  Admitted for acute perforated sigmoid diverticulitis with trace pneumoperitoneum and possible developing pelvic abscess.  Managing conservatively.  General surgery on board.  Slowly improving.   Assessment & Plan:   Principal Problem:   Diverticulitis of large intestine with perforation Active Problems:   DM2 (diabetes mellitus, type 2) ([place])   HLD (hyperlipidemia)   HTN (hypertension)  Sigmoid diverticulitis with trace pneumoperitoneum possibly developing pelvic abscess Surgery consulted recommended conservative management with IV fluids, pain management.on IV zosyn,.  Surgery started her on clear liquid diet as her abdominal pain has improved and she does not have any nausea or vomiting. Advanced to full liquid diet . She reports having pain last night and this morning in the lower quadrant, more in the left lower quadrant with some nausea . She remains afebrile and her wbc count is slightly increased from 13,000 to 15,000.  Repeat CT abd and pelvis done showed Enlarging abscess in the left lower abdomen anterior to the left psoas muscle, now measuring up to 4.5 cm compared with 2.6 cm previously.  IR consulted and plan for drainage tomorrow of the abscess.     Type 2 DM:   CBG (last 3)  Recent Labs    08/11/18 0404 08/11/18 0828 08/11/18 1155  GLUCAP 101* 94 84  continue with SSI while hospitalized.     Hypertension:  Well controlled.   Hypothyroidism:  Resume synthroid.    Morbid obesity BMP is 43.      DVT prophylaxis: Lovenox Code Status : full code Family Communication: None  at bedside  disposition Plan:  Pending Clinical improvement   Consultants:    surgery   IR  Procedures: abscess drain tomrrow.    Antimicrobials: IV Zosyn  Subjective: Diarrhea today. Still some lower abd discomfort.  Has some nausea with abd pain , no vomiting .   Objective: Vitals:   08/10/18 0417 08/10/18 1340 08/10/18 2020 08/11/18 0408  BP: 138/74 (!) 153/70 (!) 146/80 (!) 147/66  Pulse: (!) 54 (!) 54 (!) 57 (!) 54  Resp: 14 20 18    Temp: 98.2 F (36.8 C) 98.4 F (36.9 C) 98.4 F (36.9 C) 97.9 F (36.6 C)  TempSrc: Oral Oral Oral Oral  SpO2: 94% 98% 97% 95%  Weight:      Height:        Intake/Output Summary (Last 24 hours) at 08/11/2018 1304 Last data filed at 08/11/2018 1125 Gross per 24 hour  Intake 480 ml  Output -  Net 480 ml   Filed Weights   08/05/18 0057  Weight: 117.7 kg    Examination:  General exam: Appears calm and comfortable , no distress. Noted.  Respiratory system: Clear to auscultation. Respiratory effort normal. No wheezing or rhonchi.  Cardiovascular system: S1 & S2 heard, RRR. No JVD,  No pedal edema. Gastrointestinal system: Abdomen is mildly tender in the lower quadrant.  No organomegaly or masses felt. Normal bowel sounds heard. Central nervous system: Alert and oriented. Non focal.  Extremities: Symmetric 5 x 5 power. Skin: No rashes, lesions or ulcers Psychiatry:  Mood & affect appropriate.     Data Reviewed: I have personally reviewed following  labs and imaging studies  CBC: Recent Labs  Lab 08/05/18 0105  08/07/18 0301 08/08/18 0304 08/09/18 0433 08/10/18 0158 08/11/18 0250  WBC 11.5*   < > 18.0* 15.5* 13.3* 14.0* 15.3*  NEUTROABS 9.7*  --   --   --   --   --   --   HGB 15.3*   < > 13.2 12.3 12.0 11.9* 12.3  HCT 49.5*   < > 42.5 37.8 37.6 36.6 37.6  MCV 84.8   < > 83.3 82.7 83.2 83.2 82.1  PLT 311   < > 316 327 337 336 376   < > = values in this interval not displayed.   Basic Metabolic Panel: Recent Labs   Lab 08/06/18 0454 08/07/18 0301 08/08/18 0304 08/10/18 0941 08/11/18 0250  NA 135 136 139 141 140  K 3.7 4.1 3.6 3.8 3.7  CL 101 102 106 108 107  CO2 18* 18* 20* 23 24  GLUCOSE 125* 106* 90 122* 113*  BUN 10 15 13  7* 6*  CREATININE 0.84 0.95 0.73 0.46 0.50  CALCIUM 8.5* 8.6* 8.3* 8.4* 8.4*   GFR: Estimated Creatinine Clearance: 94.8 mL/min (by C-G formula based on SCr of 0.5 mg/dL). Liver Function Tests: No results for input(s): AST, ALT, ALKPHOS, BILITOT, PROT, ALBUMIN in the last 168 hours. No results for input(s): LIPASE, AMYLASE in the last 168 hours. No results for input(s): AMMONIA in the last 168 hours. Coagulation Profile: Recent Labs  Lab 08/11/18 0831  INR 1.1   Cardiac Enzymes: No results for input(s): CKTOTAL, CKMB, CKMBINDEX, TROPONINI in the last 168 hours. BNP (last 3 results) No results for input(s): PROBNP in the last 8760 hours. HbA1C: No results for input(s): HGBA1C in the last 72 hours. CBG: Recent Labs  Lab 08/10/18 2022 08/11/18 0001 08/11/18 0404 08/11/18 0828 08/11/18 1155  GLUCAP 97 102* 101* 94 84   Lipid Profile: No results for input(s): CHOL, HDL, LDLCALC, TRIG, CHOLHDL, LDLDIRECT in the last 72 hours. Thyroid Function Tests: No results for input(s): TSH, T4TOTAL, FREET4, T3FREE, THYROIDAB in the last 72 hours. Anemia Panel: No results for input(s): VITAMINB12, FOLATE, FERRITIN, TIBC, IRON, RETICCTPCT in the last 72 hours. Sepsis Labs: No results for input(s): PROCALCITON, LATICACIDVEN in the last 168 hours.  No results found for this or any previous visit (from the past 240 hour(s)).       Radiology Studies: Ct Abdomen Pelvis W Contrast  Result Date: 08/10/2018 CLINICAL DATA:  Abdominal infection EXAM: CT ABDOMEN AND PELVIS WITH CONTRAST TECHNIQUE: Multidetector CT imaging of the abdomen and pelvis was performed using the standard protocol following bolus administration of intravenous contrast. CONTRAST:  160mL OMNIPAQUE  IOHEXOL 300 MG/ML  SOLN COMPARISON:  None. FINDINGS: Lower chest: Small right pleural effusion. Bibasilar atelectasis. Heart is normal size. No focal hepatic abnormality.  Gallbladder unremarkable. Hepatobiliary: No focal hepatic abnormality. Gallbladder unremarkable. Pancreas: No focal abnormality or ductal dilatation. Spleen: No focal abnormality.  Normal size. Adrenals/Urinary Tract: mild left hydronephrosis, new since prior study. No hydronephrosis on the right. Adrenal glands and urinary bladder unremarkable. No focal renal abnormality. Stomach/Bowel: Sigmoid diverticulosis. Inflammatory stranding noted with fluid collection again noted anterior to the left psoas muscle with air-fluid level. This measures 4.5 x 3.7 cm compared with 2.6 cm maximally previously. No evidence of bowel obstruction. Stomach and small bowel decompressed, grossly unremarkable. Vascular/Lymphatic: Scattered calcifications in the aorta. No evidence of aneurysm or adenopathy. Reproductive: Uterus and adnexa unremarkable.  No mass. Other: Small amount of free  fluid in the pelvis. A few locules of extraluminal gas again noted adjacent to the sigmoid colon, similar to prior study. Musculoskeletal: No acute bony abnormality. IMPRESSION: Changes of sigmoid diverticulitis. Few locules of extraluminal gas and stranding in the left lower quadrant. Enlarging abscess in the left lower abdomen anterior to the left psoas muscle, now measuring up to 4.5 cm compared with 2.6 cm previously. This appears to be the cause of mild left hydronephrosis which is new since prior study, likely due to mass effect on the left ureter. Small amount of free fluid in the pelvis. Small right pleural effusion.  Bibasilar atelectasis. Electronically Signed   By: Rolm Baptise M.D.   On: 08/10/2018 21:21        Scheduled Meds: . atorvastatin  80 mg Oral Daily  . docusate sodium  100 mg Oral BID  . [START ON 08/13/2018] enoxaparin (LOVENOX) injection  40 mg  Subcutaneous Q24H  . ezetimibe  10 mg Oral Daily  . insulin aspart  0-9 Units Subcutaneous Q4H  . levothyroxine  125 mcg Oral Q0600  . mouth rinse  15 mL Mouth Rinse BID  . pantoprazole  40 mg Oral Daily   Continuous Infusions: . sodium chloride 75 mL/hr at 08/11/18 1128  . meropenem (MERREM) IV 1 g (08/11/18 1259)     LOS: 6 days    Time spent: 28 minutes.     Hosie Poisson, MD [facility] Pager [phone]  If 7PM-7AM, please contact night-coverage [email or web address] Password [facility] 08/11/2018, 1:04 PM

## 2018-08-11 NOTE — Plan of Care (Signed)

## 2018-08-11 NOTE — Plan of Care (Signed)
  Problem: Pain Managment: Goal: General experience of comfort will improve Outcome: Progressing   

## 2018-08-12 ENCOUNTER — Inpatient Hospital Stay (HOSPITAL_COMMUNITY): Payer: PRIVATE HEALTH INSURANCE

## 2018-08-12 LAB — CBC
HCT: 36.8 % (ref 36.0–46.0)
Hemoglobin: 11.9 g/dL — ABNORMAL LOW (ref 12.0–15.0)
MCH: 26.7 pg (ref 26.0–34.0)
MCHC: 32.3 g/dL (ref 30.0–36.0)
MCV: 82.5 fL (ref 80.0–100.0)
Platelets: 369 10*3/uL (ref 150–400)
RBC: 4.46 MIL/uL (ref 3.87–5.11)
RDW: 14.2 % (ref 11.5–15.5)
WBC: 14.5 10*3/uL — ABNORMAL HIGH (ref 4.0–10.5)
nRBC: 0 % (ref 0.0–0.2)

## 2018-08-12 LAB — GLUCOSE, CAPILLARY
Glucose-Capillary: 85 mg/dL (ref 70–99)
Glucose-Capillary: 85 mg/dL (ref 70–99)
Glucose-Capillary: 100 mg/dL — ABNORMAL HIGH (ref 70–99)
Glucose-Capillary: 79 mg/dL (ref 70–99)

## 2018-08-12 MED ORDER — INSULIN ASPART 100 UNIT/ML ~~LOC~~ SOLN
0.0000 [IU] | Freq: Three times a day (TID) | SUBCUTANEOUS | Status: DC
  Administered 2018-08-13: 1 [IU] via SUBCUTANEOUS
  Administered 2018-08-14: 2 [IU] via SUBCUTANEOUS
  Administered 2018-08-15: 13:00:00 1 [IU] via SUBCUTANEOUS

## 2018-08-12 MED ORDER — MIDAZOLAM HCL 2 MG/2ML IJ SOLN
INTRAMUSCULAR | Status: AC | PRN
  Administered 2018-08-12 (×2): 1 mg via INTRAVENOUS

## 2018-08-12 MED ORDER — SODIUM CHLORIDE 0.9% FLUSH
5.0000 mL | Freq: Three times a day (TID) | INTRAVENOUS | Status: DC
  Administered 2018-08-12 – 2018-08-15 (×9): 5 mL

## 2018-08-12 MED ORDER — MIDAZOLAM HCL 2 MG/2ML IJ SOLN
INTRAMUSCULAR | Status: AC
  Filled 2018-08-12: qty 4

## 2018-08-12 MED ORDER — FENTANYL CITRATE (PF) 100 MCG/2ML IJ SOLN
INTRAMUSCULAR | Status: AC
  Filled 2018-08-12: qty 4

## 2018-08-12 MED ORDER — LIDOCAINE HCL 1 % IJ SOLN
INTRAMUSCULAR | Status: AC
  Filled 2018-08-12: qty 20

## 2018-08-12 MED ORDER — FENTANYL CITRATE (PF) 100 MCG/2ML IJ SOLN
INTRAMUSCULAR | Status: AC | PRN
  Administered 2018-08-12 (×2): 50 ug via INTRAVENOUS

## 2018-08-12 NOTE — Plan of Care (Signed)
  Problem: Pain Managment: Goal: General experience of comfort will improve Outcome: Progressing   

## 2018-08-12 NOTE — Progress Notes (Signed)
PROGRESS NOTE    Kristie Allen  GYK:[id number] DOB: 08-Jan-1956 DOA: 08/05/2018 PCP: [name]., MD    Brief Narrative:  63 year old female with PMH of DM 2, HTN, HLD, hypothyroid, recent hospitalization 7/93-9/03 for uncomplicated diverticulitis, did well with conservative management and discharged home, returned with worsening LLQ abdominal pain, fever, chills and dry heaves.  Admitted for acute perforated sigmoid diverticulitis with trace pneumoperitoneum and possible developing pelvic abscess.  Managing conservatively.  General surgery on board.  Slowly improving.   Assessment & Plan:   Principal Problem:   Diverticulitis of large intestine with perforation Active Problems:   DM2 (diabetes mellitus, type 2) ([place])   HLD (hyperlipidemia)   HTN (hypertension)  Sigmoid diverticulitis with trace pneumoperitoneum possibly developing pelvic abscess Surgery consulted recommended conservative management with IV fluids, pain management.on IV zosyn,.  Surgery started her on clear liquid diet as her abdominal pain has improved and she does not have any nausea or vomiting. Advanced to full liquid diet . She reports having pain last night and this morning in the lower quadrant, more in the left lower quadrant with some nausea . She remains afebrile and her wbc count improving.  Repeat CT abd and pelvis done showed Enlarging abscess in the left lower abdomen anterior to the left psoas muscle, now measuring up to 4.5 cm compared with 2.6 cm previously.  IR consulted and plan for CT guided drain of the abscess today.  Post procedure , can go back to clear liquid diet.  Cultures pending.    Type 2 DM:   CBG (last 3)  Recent Labs    08/11/18 2334 08/12/18 0346 08/12/18 0859  GLUCAP 91 85 85  continue with SSI while hospitalized.     Hypertension:  Sub optimal from pain.  Continue to monitor.   Hypothyroidism:  Resume synthroid.    Morbid obesity BMP is 43.      DVT  prophylaxis: Lovenox Code Status : full code Family Communication: None at bedside  disposition Plan:  Pending Clinical improvement   Consultants:    surgery   IR  Procedures: abscess drain tomrrow.    Antimicrobials: IV Zosyn to meropenam.   Subjective: No BM today.  Pain and nausea are better.   Objective: Vitals:   08/12/18 1030 08/12/18 1035 08/12/18 1040 08/12/18 1055  BP: (!) 151/81 (!) 153/72 (!) 151/71 (!) 150/84  Pulse: 60 60 (!) 59 60  Resp: 18 18 18 16   Temp:      TempSrc:      SpO2: 97% 96% 96% 93%  Weight:      Height:        Intake/Output Summary (Last 24 hours) at 08/12/2018 1147 Last data filed at 08/12/2018 1050 Gross per 24 hour  Intake 0 ml  Output 10 ml  Net -10 ml   Filed Weights   08/05/18 0057  Weight: 117.7 kg    Examination:  General exam: Appears calm and comfortable , no distress. Noted.  Respiratory system: Clear to auscultation. Respiratory effort normal. No wheezing or rhonchi.  Cardiovascular system: S1 & S2 heard, RRR. No JVD,  No pedal edema. Gastrointestinal system: Abdomen is mildly tender in the lower quadrant.  No organomegaly or masses felt. Normal bowel sounds heard. Central nervous system: Alert and oriented. Non focal.  Extremities: Symmetric 5 x 5 power. Skin: No rashes, lesions or ulcers Psychiatry:  Mood & affect appropriate.     Data Reviewed: I have personally reviewed following labs and imaging studies  CBC: Recent Labs  Lab 08/08/18 0304 08/09/18 0433 08/10/18 0158 08/11/18 0250 08/12/18 0317  WBC 15.5* 13.3* 14.0* 15.3* 14.5*  HGB 12.3 12.0 11.9* 12.3 11.9*  HCT 37.8 37.6 36.6 37.6 36.8  MCV 82.7 83.2 83.2 82.1 82.5  PLT 327 337 336 376 734   Basic Metabolic Panel: Recent Labs  Lab 08/06/18 0454 08/07/18 0301 08/08/18 0304 08/10/18 0941 08/11/18 0250  NA 135 136 139 141 140  K 3.7 4.1 3.6 3.8 3.7  CL 101 102 106 108 107  CO2 18* 18* 20* 23 24  GLUCOSE 125* 106* 90 122* 113*  BUN 10  15 13  7* 6*  CREATININE 0.84 0.95 0.73 0.46 0.50  CALCIUM 8.5* 8.6* 8.3* 8.4* 8.4*   GFR: Estimated Creatinine Clearance: 94.8 mL/min (by C-G formula based on SCr of 0.5 mg/dL). Liver Function Tests: No results for input(s): AST, ALT, ALKPHOS, BILITOT, PROT, ALBUMIN in the last 168 hours. No results for input(s): LIPASE, AMYLASE in the last 168 hours. No results for input(s): AMMONIA in the last 168 hours. Coagulation Profile: Recent Labs  Lab 08/11/18 0831  INR 1.1   Cardiac Enzymes: No results for input(s): CKTOTAL, CKMB, CKMBINDEX, TROPONINI in the last 168 hours. BNP (last 3 results) No results for input(s): PROBNP in the last 8760 hours. HbA1C: No results for input(s): HGBA1C in the last 72 hours. CBG: Recent Labs  Lab 08/11/18 1719 08/11/18 1935 08/11/18 2334 08/12/18 0346 08/12/18 0859  GLUCAP 104* 105* 91 85 85   Lipid Profile: No results for input(s): CHOL, HDL, LDLCALC, TRIG, CHOLHDL, LDLDIRECT in the last 72 hours. Thyroid Function Tests: No results for input(s): TSH, T4TOTAL, FREET4, T3FREE, THYROIDAB in the last 72 hours. Anemia Panel: No results for input(s): VITAMINB12, FOLATE, FERRITIN, TIBC, IRON, RETICCTPCT in the last 72 hours. Sepsis Labs: No results for input(s): PROCALCITON, LATICACIDVEN in the last 168 hours.  No results found for this or any previous visit (from the past 240 hour(s)).       Radiology Studies: Ct Abdomen Pelvis W Contrast  Result Date: 08/10/2018 CLINICAL DATA:  Abdominal infection EXAM: CT ABDOMEN AND PELVIS WITH CONTRAST TECHNIQUE: Multidetector CT imaging of the abdomen and pelvis was performed using the standard protocol following bolus administration of intravenous contrast. CONTRAST:  165mL OMNIPAQUE IOHEXOL 300 MG/ML  SOLN COMPARISON:  None. FINDINGS: Lower chest: Small right pleural effusion. Bibasilar atelectasis. Heart is normal size. No focal hepatic abnormality.  Gallbladder unremarkable. Hepatobiliary: No focal  hepatic abnormality. Gallbladder unremarkable. Pancreas: No focal abnormality or ductal dilatation. Spleen: No focal abnormality.  Normal size. Adrenals/Urinary Tract: mild left hydronephrosis, new since prior study. No hydronephrosis on the right. Adrenal glands and urinary bladder unremarkable. No focal renal abnormality. Stomach/Bowel: Sigmoid diverticulosis. Inflammatory stranding noted with fluid collection again noted anterior to the left psoas muscle with air-fluid level. This measures 4.5 x 3.7 cm compared with 2.6 cm maximally previously. No evidence of bowel obstruction. Stomach and small bowel decompressed, grossly unremarkable. Vascular/Lymphatic: Scattered calcifications in the aorta. No evidence of aneurysm or adenopathy. Reproductive: Uterus and adnexa unremarkable.  No mass. Other: Small amount of free fluid in the pelvis. A few locules of extraluminal gas again noted adjacent to the sigmoid colon, similar to prior study. Musculoskeletal: No acute bony abnormality. IMPRESSION: Changes of sigmoid diverticulitis. Few locules of extraluminal gas and stranding in the left lower quadrant. Enlarging abscess in the left lower abdomen anterior to the left psoas muscle, now measuring up to 4.5 cm compared with 2.6  cm previously. This appears to be the cause of mild left hydronephrosis which is new since prior study, likely due to mass effect on the left ureter. Small amount of free fluid in the pelvis. Small right pleural effusion.  Bibasilar atelectasis. Electronically Signed   By: Rolm Baptise M.D.   On: 08/10/2018 21:21        Scheduled Meds: . fentaNYL      . lidocaine      . midazolam      . atorvastatin  80 mg Oral Daily  . docusate sodium  100 mg Oral BID  . [START ON 08/13/2018] enoxaparin (LOVENOX) injection  40 mg Subcutaneous Q24H  . ezetimibe  10 mg Oral Daily  . insulin aspart  0-9 Units Subcutaneous Q4H  . levothyroxine  125 mcg Oral Q0600  . mouth rinse  15 mL Mouth Rinse BID   . pantoprazole  40 mg Oral Daily  . sodium chloride flush  5 mL Intracatheter Q8H   Continuous Infusions: . sodium chloride 75 mL/hr at 08/12/18 0113  . meropenem (MERREM) IV 1 g (08/12/18 0543)     LOS: 7 days    Time spent: 30 minutes.     Hosie Poisson, MD [facility] Pager [phone]  If 7PM-7AM, please contact night-coverage [email or web address] Password [facility] 08/12/2018, 11:47 AM

## 2018-08-12 NOTE — Procedures (Signed)
Interventional Radiology Procedure Note  Procedure: CT Guided Drainage of Diverticular Abscess  Complications: None  Estimated Blood Loss: < 10 mL  Findings: 12 Fr drain placed in sigmoid diverticular abscess from lateral LLQ approach with return of milky brown, purulent fluid. Fluid sample sent for culture analysis. Drain attached to suction bulb drainage.  Will follow.  Kristie Allen. Kathlene Cote, M.D Pager:  [phone]

## 2018-08-12 NOTE — Progress Notes (Signed)
[facility] Progress Note     Subjective: CC: diverticulitis Patient reports some worsened pain overnight requiring morphine. Pain is improved this AM. Patient had a small BM yesterday. Denies nausea or vomiting. Hopeful that IR is able to place drain today.   Objective: Vital signs in last 24 hours: Temp:  [98.2 F (36.8 C)-98.8 F (37.1 C)] 98.2 F (36.8 C) (04/26 0828) Pulse Rate:  [49-56] 56 (04/26 0828) Resp:  [16] 16 (04/26 0828) BP: (142-173)/(70-83) 151/72 (04/26 0828) SpO2:  [94 %-99 %] 95 % (04/26 0828) Last BM Date: 08/11/18  Intake/Output from previous day: 04/25 0701 - 04/26 0700 In: 240 [P.O.:240] Out: -  Intake/Output this shift: No intake/output data recorded.  PE: Gen: Alert, NAD, pleasant Card: Regular rate and rhythm, pedal pulses 2+ BL Pulm: Normal effort, clear to auscultation bilaterally Abd: Soft,nontender,no rebound or guarding,non-distended, +BS Skin: warm and dry, no rashes  Psych: A&Ox3  Lab Results:  Recent Labs    08/11/18 0250 08/12/18 0317  WBC 15.3* 14.5*  HGB 12.3 11.9*  HCT 37.6 36.8  PLT 376 369   BMET Recent Labs    08/10/18 0941 08/11/18 0250  NA 141 140  K 3.8 3.7  CL 108 107  CO2 23 24  GLUCOSE 122* 113*  BUN 7* 6*  CREATININE 0.46 0.50  CALCIUM 8.4* 8.4*   PT/INR Recent Labs    08/11/18 0831  LABPROT 14.4  INR 1.1   CMP     Component Value Date/Time   NA 140 08/11/2018 0250   K 3.7 08/11/2018 0250   CL 107 08/11/2018 0250   CO2 24 08/11/2018 0250   GLUCOSE 113 (H) 08/11/2018 0250   BUN 6 (L) 08/11/2018 0250   CREATININE 0.50 08/11/2018 0250   CALCIUM 8.4 (L) 08/11/2018 0250   PROT 8.1 08/01/2018 2037   ALBUMIN 4.3 08/01/2018 2037   AST 19 08/01/2018 2037   ALT 33 08/01/2018 2037   ALKPHOS 79 08/01/2018 2037   BILITOT 1.2 08/01/2018 2037   GFRNONAA >60 08/11/2018 0250   GFRAA >60 08/11/2018 0250   Lipase     Component Value Date/Time   LIPASE 30 08/01/2018 2037        Studies/Results: Ct Abdomen Pelvis W Contrast  Result Date: 08/10/2018 CLINICAL DATA:  Abdominal infection EXAM: CT ABDOMEN AND PELVIS WITH CONTRAST TECHNIQUE: Multidetector CT imaging of the abdomen and pelvis was performed using the standard protocol following bolus administration of intravenous contrast. CONTRAST:  133mL OMNIPAQUE IOHEXOL 300 MG/ML  SOLN COMPARISON:  None. FINDINGS: Lower chest: Small right pleural effusion. Bibasilar atelectasis. Heart is normal size. No focal hepatic abnormality.  Gallbladder unremarkable. Hepatobiliary: No focal hepatic abnormality. Gallbladder unremarkable. Pancreas: No focal abnormality or ductal dilatation. Spleen: No focal abnormality.  Normal size. Adrenals/Urinary Tract: mild left hydronephrosis, new since prior study. No hydronephrosis on the right. Adrenal glands and urinary bladder unremarkable. No focal renal abnormality. Stomach/Bowel: Sigmoid diverticulosis. Inflammatory stranding noted with fluid collection again noted anterior to the left psoas muscle with air-fluid level. This measures 4.5 x 3.7 cm compared with 2.6 cm maximally previously. No evidence of bowel obstruction. Stomach and small bowel decompressed, grossly unremarkable. Vascular/Lymphatic: Scattered calcifications in the aorta. No evidence of aneurysm or adenopathy. Reproductive: Uterus and adnexa unremarkable.  No mass. Other: Small amount of free fluid in the pelvis. A few locules of extraluminal gas again noted adjacent to the sigmoid colon, similar to prior study. Musculoskeletal: No acute bony abnormality. IMPRESSION: Changes of sigmoid diverticulitis.  Few locules of extraluminal gas and stranding in the left lower quadrant. Enlarging abscess in the left lower abdomen anterior to the left psoas muscle, now measuring up to 4.5 cm compared with 2.6 cm previously. This appears to be the cause of mild left hydronephrosis which is new since prior study, likely due to mass effect on  the left ureter. Small amount of free fluid in the pelvis. Small right pleural effusion.  Bibasilar atelectasis. Electronically Signed   By: Rolm Baptise M.D.   On: 08/10/2018 21:21    Anti-infectives: Anti-infectives (From admission, onward)   Start     Dose/Rate Route Frequency Ordered Stop   08/07/18 1300  meropenem (MERREM) 1 g in sodium chloride 0.9 % 100 mL IVPB     1 g 200 mL/hr over 30 Minutes Intravenous Every 8 hours 08/07/18 0952     08/05/18 0800  piperacillin-tazobactam (ZOSYN) IVPB 3.375 g  Status:  Discontinued     3.375 g 12.5 mL/hr over 240 Minutes Intravenous Every 8 hours 08/05/18 0508 08/07/18 0951   08/05/18 0130  piperacillin-tazobactam (ZOSYN) IVPB 3.375 g     3.375 g 100 mL/hr over 30 Minutes Intravenous  Once 08/05/18 0123 08/05/18 0211       Assessment/Plan HTN T2DM HLD Hypothyroidism Morbid Obesity - BMI 43 AKI   Acute sigmoid diverticulitis with microperforation and possible developing abscess - WBC14.5, afebrile -CLD as tolerated today and NPO after MN  - CT yesterday showed enlarging L pelvic abscess anterior to L psoas muscle - IR to attempt drain placement today - ideally this would resolve without acute surgical intervention, but if patient worsens or fails to improve may need resection - we will continue to follow  - recommend colonoscopy in 6-8 weeks   FEN:NPO - ok to resume CLD after procedure VTE: SCDs, lovenox ID: Zosyn 4/19>4/21; Merrem 4/22>>  LOS: 7 days    Brigid Re , [facility] 08/12/2018, 9:09 AM Pager: [phone] Consults: [phone]

## 2018-08-12 NOTE — Consult Note (Signed)
Chief Complaint: Patient was seen in consultation today for abdominal abscess aspiration/possible drain placement.  Referring Physician(s): Brigid Re, PA-C  Supervising Physician: Aletta Edouard  Patient Status: [facility] - In-pt  History of Present Illness: Kristie Allen is a 63 y.o. female with a past medical history significant for hypthyroidism, DM, HLD, HTN and diverticulitis who initially presented to [facility] ED on 08/01/18 with complaints of worsening left lower quadrant pain x 3 days. She was noted to be afebrile with a WBC of 17.6, CT abdomen/pelvis with contrast showed acute, uncomplicated diverticulitis. Surgery was consulted who recommended admission for antibiotics, fluids and pain control. She was admitted from 4/15 - 4/17 after clinical improvement and was discharged on PO Augmentin with plans to follow up with GI in 1 month. Unfortunately, her pain began to worsen again after discharge and she again presented to the ED on 4/19. She was found to be febrile and had developed nausea and vomiting in addition to abdominal pain. CT abd/pelvis with contrast showed acute perforated sigmoid diverticulitis, trace pneumoperitoneum with small volume free fluid within the pelvis and LLQ, 26 mm discrete fluid collection anterior to the left psaos muscle at pelvic brim concerning for possible developing abscess. She was admitted for further evaluation and management. Surgery was consulted who recommended IV antibiotics and continued monitoring. She has been closely monitored by surgery and hospital services since admission, on 4/24 she again complained of worsening abdominal pain and a repeat CT abd/pelvis with contrast was performed that day which showed enlargement of the previously seen abscess in the LLQ anterior to the left psoas muscle, now measuring 4.5 cm as well as new mild left hydronephrosis likely due to mass effect on the left ureter. IR has been consulted for possible  aspiration and drain placement.  Patient reports her pain is well controlled currently although when the pain medicine wears off she does experience some pain in the lower part of her abdomen, mostly on the left side. She denies any other complaints. She states understanding of requested procedure and that she will need further imaging today due to abscess being very deep with possibly no safe window, she wishes to proceed.  Past Medical History:  Diagnosis Date   Diabetes mellitus without complication ([place])    Diverticulitis 07/2018   Hypertension    Hypothyroidism     Past Surgical History:  Procedure Laterality Date   VARICOSE VEIN SURGERY      Allergies: Bisphosphonates  Medications: Prior to Admission medications   Medication Sig Start Date End Date Taking? Authorizing Provider  aspirin EC 81 MG tablet Take 81 mg by mouth daily.   Yes [provider]  atorvastatin (LIPITOR) 80 MG tablet Take 80 mg by mouth daily. 07/30/18  Yes [provider]  cholecalciferol (VITAMIN D3) 25 MCG (1000 UT) tablet Take 1,000 Units by mouth daily.   Yes [provider]  Empagliflozin-metFORMIN HCl ER (SYNJARDY XR) 12.08-998 MG TB24 Take 1 tablet by mouth daily.   Yes [provider]  ezetimibe (ZETIA) 10 MG tablet Take 10 mg by mouth daily. 06/13/18  Yes [provider]  levothyroxine (SYNTHROID, LEVOTHROID) 125 MCG tablet Take 125 mcg by mouth daily before breakfast. 04/23/18  Yes [provider]  lisinopril-hydrochlorothiazide (PRINZIDE,ZESTORETIC) 20-12.5 MG tablet Take 1 tablet by mouth daily. 07/30/18  Yes [provider]  magnesium 30 MG tablet Take 30 mg by mouth daily.   Yes [provider]  meloxicam (MOBIC) 7.5 MG tablet Take  7.5 mg by mouth daily as needed for pain.  05/14/18  Yes [provider]  metroNIDAZOLE (METROCREAM) 0.75 % cream Apply 1 application topically 2 (two) times daily. 07/16/18  Yes [provider]  pantoprazole (PROTONIX) 40 MG tablet Take 40 mg by mouth daily. 04/24/18  Yes [provider]  senna (SENOKOT) 8.6 MG TABS tablet Take 1 tablet (8.6 mg total) by mouth daily as needed for mild constipation. 08/03/18  Yes Domenic Polite, MD     Family History  Problem Relation Age of Onset   Anuerysm Mother    Diabetes Mother    Hypertension Mother    Aneurysm Sister    Diabetes Sister    Aneurysm Brother    Diabetes Brother    Hypertension Brother    Diabetes Father    Heart attack Father    Hypertension Father     Social History   Socioeconomic History   Marital status: Widowed    Spouse name: Not on file   Number of children: Not on file   Years of education: Not on file   Highest education level: Not on file  Occupational History   Not on file  Social Needs   Financial resource strain: Not on file   Food insecurity:    Worry: Not on file    Inability: Not on file   Transportation needs:    Medical: Not on file    Non-medical: Not on file  Tobacco Use   Smoking status: Former Smoker   Smokeless tobacco: Never Used   Tobacco comment: " back in the 80"s"  Substance and Sexual Activity   Alcohol use: Never    Frequency: Never   Drug use: Never   Sexual activity: Not on file  Lifestyle   Physical activity:    Days per week: Not on file    Minutes per session: Not on file   Stress: Not on file  Relationships   Social connections:    Talks on phone: Not on file    Gets together: Not on file    Attends religious service: Not on file    Active member of club or organization: Not on file    Attends meetings of clubs or organizations: Not on file    Relationship status: Not on file  Other Topics Concern   Not on file  Social History Narrative   Not on file     Review of Systems: A 12 point ROS discussed and pertinent positives are indicated in the HPI above.  All other systems are negative.  Review of  Systems  Constitutional: Negative for chills and fever.  Respiratory: Negative for cough and shortness of breath.   Cardiovascular: Negative for chest pain.  Gastrointestinal: Negative for abdominal pain, diarrhea, nausea and vomiting.  Musculoskeletal: Negative for back pain.  Skin: Negative for color change.  Neurological: Negative for dizziness and headaches.    Vital Signs: BP (!) 142/71 (BP Location: Left Arm)    Pulse (!) 52    Temp 98.6 F (37 C) (Oral)    Resp 16    Ht 5\' 5"  (1.651 m)    Wt 259 lb 7.7 oz (117.7 kg)    SpO2 94%    BMI 43.18 kg/m   Physical Exam Vitals signs and nursing note reviewed.  Constitutional:      General: She is not in acute distress.    Appearance: She is obese.  HENT:     Head: Normocephalic.  Cardiovascular:  Rate and Rhythm: Normal rate and regular rhythm.  Pulmonary:     Effort: Pulmonary effort is normal.     Breath sounds: Normal breath sounds.  Abdominal:     General: Bowel sounds are normal.     Palpations: Abdomen is soft.     Tenderness: There is no abdominal tenderness.  Skin:    General: Skin is warm and dry.  Neurological:     Mental Status: She is alert and oriented to person, place, and time.  Psychiatric:        Mood and Affect: Mood normal.        Behavior: Behavior normal.        Thought Content: Thought content normal.        Judgment: Judgment normal.      MD Evaluation Airway: WNL Heart: WNL Abdomen: WNL Chest/ Lungs: WNL ASA  Classification: 3 Mallampati/Airway Score: One   Imaging: Ct Abdomen Pelvis W Contrast  Result Date: 08/10/2018 CLINICAL DATA:  Abdominal infection EXAM: CT ABDOMEN AND PELVIS WITH CONTRAST TECHNIQUE: Multidetector CT imaging of the abdomen and pelvis was performed using the standard protocol following bolus administration of intravenous contrast. CONTRAST:  120mL OMNIPAQUE IOHEXOL 300 MG/ML  SOLN COMPARISON:  None. FINDINGS: Lower chest: Small right pleural effusion. Bibasilar  atelectasis. Heart is normal size. No focal hepatic abnormality.  Gallbladder unremarkable. Hepatobiliary: No focal hepatic abnormality. Gallbladder unremarkable. Pancreas: No focal abnormality or ductal dilatation. Spleen: No focal abnormality.  Normal size. Adrenals/Urinary Tract: mild left hydronephrosis, new since prior study. No hydronephrosis on the right. Adrenal glands and urinary bladder unremarkable. No focal renal abnormality. Stomach/Bowel: Sigmoid diverticulosis. Inflammatory stranding noted with fluid collection again noted anterior to the left psoas muscle with air-fluid level. This measures 4.5 x 3.7 cm compared with 2.6 cm maximally previously. No evidence of bowel obstruction. Stomach and small bowel decompressed, grossly unremarkable. Vascular/Lymphatic: Scattered calcifications in the aorta. No evidence of aneurysm or adenopathy. Reproductive: Uterus and adnexa unremarkable.  No mass. Other: Small amount of free fluid in the pelvis. A few locules of extraluminal gas again noted adjacent to the sigmoid colon, similar to prior study. Musculoskeletal: No acute bony abnormality. IMPRESSION: Changes of sigmoid diverticulitis. Few locules of extraluminal gas and stranding in the left lower quadrant. Enlarging abscess in the left lower abdomen anterior to the left psoas muscle, now measuring up to 4.5 cm compared with 2.6 cm previously. This appears to be the cause of mild left hydronephrosis which is new since prior study, likely due to mass effect on the left ureter. Small amount of free fluid in the pelvis. Small right pleural effusion.  Bibasilar atelectasis. Electronically Signed   By: Rolm Baptise M.D.   On: 08/10/2018 21:21   Ct Abdomen Pelvis W Contrast  Result Date: 08/05/2018 CLINICAL DATA:  63 y/o F; lower abdominal pain with increasing severity of the right lower quadrant. EXAM: CT ABDOMEN AND PELVIS WITH CONTRAST TECHNIQUE: Multidetector CT imaging of the abdomen and pelvis was  performed using the standard protocol following bolus administration of intravenous contrast. CONTRAST:  130mL OMNIPAQUE IOHEXOL 300 MG/ML  SOLN COMPARISON:  None. FINDINGS: Lower chest: Small hiatal hernia. Hepatobiliary: No focal liver abnormality is seen. No gallstones, gallbladder wall thickening, or biliary dilatation. Pancreas: Unremarkable. No pancreatic ductal dilatation or surrounding inflammatory changes. Spleen: Normal in size without focal abnormality. Adrenals/Urinary Tract: Adrenal glands are unremarkable. Kidneys are normal, without renal calculi, focal lesion, or hydronephrosis. Bladder is unremarkable. Stomach/Bowel: Acute perforated sigmoid diverticulitis. Trace  volume of pneumoperitoneum. Small volume of free fluid within the pelvis and left lower quadrant as well as a 26 mm collection anterior to the psoas muscle at pelvic brim (series 3, image 62). No additional obstructive or inflammatory changes of the small or large bowel. Vascular/Lymphatic: Aortic atherosclerosis. No enlarged abdominal or pelvic lymph nodes. Reproductive: Uterus and bilateral adnexa are unremarkable. Other: No abdominal wall hernia or abnormality. Musculoskeletal: No fracture is seen. IMPRESSION: 1. Acute perforated sigmoid diverticulitis. Trace pneumoperitoneum small volume free fluid within pelvis and left lower quadrant. 2. 26 mm discrete fluid collection anterior to the left psoas muscle at pelvic brim, probably a developing abscess. 3. Small hiatal hernia. 4. Aortic Atherosclerosis (ICD10-I70.0). Electronically Signed   By: Kristine Garbe M.D.   On: 08/05/2018 03:28   Ct Abdomen Pelvis W Contrast  Result Date: 08/01/2018 CLINICAL DATA:  63 year old female with abdominal pain and vomit EXAM: CT ABDOMEN AND PELVIS WITH CONTRAST TECHNIQUE: Multidetector CT imaging of the abdomen and pelvis was performed using the standard protocol following bolus administration of intravenous contrast. CONTRAST:  130mL  OMNIPAQUE IOHEXOL 300 MG/ML  SOLN COMPARISON:  None. FINDINGS: Lower chest: No acute finding Hepatobiliary: Unremarkable liver.  Unremarkable gallbladder Pancreas: Unremarkable Spleen: Unremarkable Adrenals/Urinary Tract: Unremarkable appearance of the adrenal glands. No evidence of hydronephrosis of the right or left kidney. No nephrolithiasis. Unremarkable course of the bilateral ureters. Unremarkable appearance of the urinary bladder. Stomach/Bowel: Unremarkable stomach. Hiatal hernia. Small bowel without distention or transition point. Normal appendix. Moderate stool burden of the proximal colon. Circumferential inflammatory changes in the proximal segment of sigmoid colon. This is in a segment of significant diverticular change. Inflammatory changes of the adjacent fat. Free fluid on the anti mesenteric border of the colon, with fluid layering dependently in the pelvis. No free air or abscess. Vascular/Lymphatic: Mild atherosclerotic changes. No aneurysm. Bilateral iliac arteries and proximal femoral arteries patent. Reproductive: Unremarkable uterus Other: None Musculoskeletal: No displaced fracture. Degenerative changes of L5-S1. IMPRESSION: Acute diverticulitis, uncomplicated. Hiatal hernia. Electronically Signed   By: Corrie Mckusick D.O.   On: 08/01/2018 21:45    Labs:  CBC: Recent Labs    08/09/18 0433 08/10/18 0158 08/11/18 0250 08/12/18 0317  WBC 13.3* 14.0* 15.3* 14.5*  HGB 12.0 11.9* 12.3 11.9*  HCT 37.6 36.6 37.6 36.8  PLT 337 336 376 369    COAGS: Recent Labs    08/11/18 0831  INR 1.1    BMP: Recent Labs    08/07/18 0301 08/08/18 0304 08/10/18 0941 08/11/18 0250  NA 136 139 141 140  K 4.1 3.6 3.8 3.7  CL 102 106 108 107  CO2 18* 20* 23 24  GLUCOSE 106* 90 122* 113*  BUN 15 13 7* 6*  CALCIUM 8.6* 8.3* 8.4* 8.4*  CREATININE 0.95 0.73 0.46 0.50  GFRNONAA >60 >60 >60 >60  GFRAA >60 >60 >60 >60    LIVER FUNCTION TESTS: Recent Labs    08/01/18 2037  BILITOT  1.2  AST 19  ALT 33  ALKPHOS 79  PROT 8.1  ALBUMIN 4.3    TUMOR MARKERS: No results for input(s): AFPTM, CEA, CA199, CHROMGRNA in the last 8760 hours.  Assessment and Plan:  63 y/o F with history of sigmoid diverticulitis previously treated with short course IV abx and outpatient PO antibiotics who presented to ED on 4/19 with worsening abdominal pain. She was found to have perforated sigmoid diverticulitis with small intraabdominal abscess. She has been followed by surgery and has been treated with  IV abx and bowel rest, however she developed worsening abdominal pain and leukocytosis on 4/24 and repeat CT shows enlargement of previously seen intraabdominal abscess now with mild left hydronephrosis likely 2/2 to mass effect on left ureter. Request has been made to IR for possible percutaneous aspiration and drain placement -- patient reviewed by Dr. Kathlene Cote who will repeat imaging prior to procedure to determine if there is a safe, approachable window prior to proceeding. Patient is aware and agrees with this plan.  Patient has been NPO since midnight, last dose of Lovenox AM 4/24. Afebrile, WBC 14.5, hgb 11.9, plt 369, INR 1.1.   Risks and benefits discussed with the patient including bleeding, infection, damage to adjacent structures, bowel perforation/fistula connection, and sepsis.  All of the patient's questions were answered, patient is agreeable to proceed.  Consent signed and in chart.  Thank you for this interesting consult.  I greatly enjoyed meeting [employer] and look forward to participating in their care.  A copy of this report was sent to the requesting provider on this date.  Electronically Signed: Joaquim Nam, PA-C 08/12/2018, 8:25 AM   I spent a total of 40 Minutes in face to face in clinical consultation, greater than 50% of which was counseling/coordinating care for possible abdominal abscess drain placement.

## 2018-08-13 LAB — CBC
HCT: 40.2 % (ref 36.0–46.0)
Hemoglobin: 13 g/dL (ref 12.0–15.0)
MCH: 26.7 pg (ref 26.0–34.0)
MCHC: 32.3 g/dL (ref 30.0–36.0)
MCV: 82.5 fL (ref 80.0–100.0)
Platelets: 401 10*3/uL — ABNORMAL HIGH (ref 150–400)
RBC: 4.87 MIL/uL (ref 3.87–5.11)
RDW: 14.1 % (ref 11.5–15.5)
WBC: 13.3 10*3/uL — ABNORMAL HIGH (ref 4.0–10.5)
nRBC: 0 % (ref 0.0–0.2)

## 2018-08-13 LAB — GLUCOSE, CAPILLARY
Glucose-Capillary: 94 mg/dL (ref 70–99)
Glucose-Capillary: 89 mg/dL (ref 70–99)
Glucose-Capillary: 91 mg/dL (ref 70–99)
Glucose-Capillary: 138 mg/dL — ABNORMAL HIGH (ref 70–99)
Glucose-Capillary: 126 mg/dL — ABNORMAL HIGH (ref 70–99)
Glucose-Capillary: 159 mg/dL — ABNORMAL HIGH (ref 70–99)

## 2018-08-13 MED ORDER — SODIUM CHLORIDE 0.9% FLUSH
10.0000 mL | INTRAVENOUS | Status: DC | PRN
  Administered 2018-08-14: 10 mL
  Filled 2018-08-13: qty 40

## 2018-08-13 NOTE — Progress Notes (Signed)
PROGRESS NOTE    Kristie Allen  CNO:[id number] DOB: [birth year] DOA: 08/05/2018 PCP: [name]., MD    Brief Narrative:  64 year old female with PMH of DM 2, HTN, HLD, hypothyroid, recent hospitalization 2/94-7/65 for uncomplicated diverticulitis, did well with conservative management and discharged home, returned with worsening LLQ abdominal pain, fever, chills and dry heaves.  Admitted for acute perforated sigmoid diverticulitis with trace pneumoperitoneum and possible developing pelvic abscess.  Managing conservatively.  General surgery on board.  Slowly improving.   Assessment & Plan:   Principal Problem:   Diverticulitis of large intestine with perforation Active Problems:   DM2 (diabetes mellitus, type 2) ([place])   HLD (hyperlipidemia)   HTN (hypertension)  Sigmoid diverticulitis with trace pneumoperitoneum possibly developing pelvic abscess Surgery consulted recommended conservative management with IV fluids, pain management. Despite IV zosyn for 3 days pt continued to have lower abdominal pain.  Repeat CT abd and pelvis done showed Enlarging abscess in the left lower abdomen anterior to the left psoas muscle, now measuring up to 4.5 cm compared with 2.6 cm previously. Antibiotics changed to IV meropenem.  IR consulted and pt underwent CT guided drain of the abscess on 4/26.  Abscess cultures are pending , showed GNR on gram stain.  Resume IV antibiotics and advance diet slowly.  Currently afebrile and leukocytosis is improving and is on full liquid diet.   Type 2 DM:   CBG (last 3)  Recent Labs    08/12/18 2132 08/13/18 0738 08/13/18 1149  GLUCAP 79 89 91  continue with SSI while hospitalized.     Hypertension:  Sub optimal from pain.  Continue to monitor.   Hypothyroidism:  Resume synthroid.    Morbid obesity BMP is 43.      DVT prophylaxis: Lovenox Code Status : full code Family Communication: None at bedside  disposition Plan:  Pending Clinical  improvement   Consultants:    surgery   IR  Procedures: abscess drain tomrrow.    Antimicrobials: IV Zosyn to meropenam.   Subjective:  no nausea or vomiting, the abdominal pain is better than  Yesterday.   Objective: Vitals:   08/12/18 1323 08/12/18 2051 08/12/18 2126 08/13/18 0456  BP: (!) 166/80 (!) 173/73 (!) 150/76 (!) 157/77  Pulse: (!) 57 (!) 56 (!) 59 60  Resp: 16     Temp: 98.3 F (36.8 C) 98.8 F (37.1 C)  97.8 F (36.6 C)  TempSrc: Oral Oral  Oral  SpO2: 96% 95%  96%  Weight:      Height:        Intake/Output Summary (Last 24 hours) at 08/13/2018 1212 Last data filed at 08/13/2018 0900 Gross per 24 hour  Intake 5634.05 ml  Output 65 ml  Net 5569.05 ml   Filed Weights   08/05/18 0057  Weight: 117.7 kg    Examination:  General exam: Appears calm and comfortable , no distress. Noted.  Respiratory system: Clear to auscultation. Respiratory effort normal. No wheezing or rhonchi.  Cardiovascular system: S1 & S2 heard, RRR. No JVD,  No pedal edema. Gastrointestinal system: Abdomen is mildly tender in the lower quadrant.  No organomegaly or masses felt. Normal bowel sounds heard. Central nervous system: Alert and oriented. Non focal.  Extremities: Symmetric 5 x 5 power. Skin: No rashes, lesions or ulcers Psychiatry:  Mood & affect appropriate.     Data Reviewed: I have personally reviewed following labs and imaging studies  CBC: Recent Labs  Lab 08/09/18 0433 08/10/18 0158 08/11/18  0250 08/12/18 0317 08/13/18 0919  WBC 13.3* 14.0* 15.3* 14.5* 13.3*  HGB 12.0 11.9* 12.3 11.9* 13.0  HCT 37.6 36.6 37.6 36.8 40.2  MCV 83.2 83.2 82.1 82.5 82.5  PLT 337 336 376 369 401*   Basic Metabolic Panel: Recent Labs  Lab 08/07/18 0301 08/08/18 0304 08/10/18 0941 08/11/18 0250  NA 136 139 141 140  K 4.1 3.6 3.8 3.7  CL 102 106 108 107  CO2 18* 20* 23 24  GLUCOSE 106* 90 122* 113*  BUN 15 13 7* 6*  CREATININE 0.95 0.73 0.46 0.50  CALCIUM 8.6*  8.3* 8.4* 8.4*   GFR: Estimated Creatinine Clearance: 94.8 mL/min (by C-G formula based on SCr of 0.5 mg/dL). Liver Function Tests: No results for input(s): AST, ALT, ALKPHOS, BILITOT, PROT, ALBUMIN in the last 168 hours. No results for input(s): LIPASE, AMYLASE in the last 168 hours. No results for input(s): AMMONIA in the last 168 hours. Coagulation Profile: Recent Labs  Lab 08/11/18 0831  INR 1.1   Cardiac Enzymes: No results for input(s): CKTOTAL, CKMB, CKMBINDEX, TROPONINI in the last 168 hours. BNP (last 3 results) No results for input(s): PROBNP in the last 8760 hours. HbA1C: No results for input(s): HGBA1C in the last 72 hours. CBG: Recent Labs  Lab 08/12/18 1142 08/12/18 1702 08/12/18 2132 08/13/18 0738 08/13/18 1149  GLUCAP 94 100* 79 89 91   Lipid Profile: No results for input(s): CHOL, HDL, LDLCALC, TRIG, CHOLHDL, LDLDIRECT in the last 72 hours. Thyroid Function Tests: No results for input(s): TSH, T4TOTAL, FREET4, T3FREE, THYROIDAB in the last 72 hours. Anemia Panel: No results for input(s): VITAMINB12, FOLATE, FERRITIN, TIBC, IRON, RETICCTPCT in the last 72 hours. Sepsis Labs: No results for input(s): PROCALCITON, LATICACIDVEN in the last 168 hours.  Recent Results (from the past 240 hour(s))  Aerobic/Anaerobic Culture (surgical/deep wound)     Status: None (Preliminary result)   Collection Time: 08/12/18 10:58 AM  Result Value Ref Range Status   Specimen Description ABSCESS  Final   Special Requests Normal  Final   Gram Stain   Final    MODERATE WBC PRESENT, PREDOMINANTLY PMN MODERATE GRAM NEGATIVE RODS Performed at [facility], [address], [place], [place] [address]    Culture PENDING  Incomplete   Report Status PENDING  Incomplete         Radiology Studies: Ct Image Guided Drainage By Percutaneous Catheter  Result Date: 08/12/2018 CLINICAL DATA:  Sigmoid diverticulitis with enlarging diverticular abscess. EXAM: CT GUIDED  CATHETER DRAINAGE OF PERITONEAL ABSCESS ANESTHESIA/SEDATION: 2.0 mg IV Versed 100 mcg IV Fentanyl Total Moderate Sedation Time:  13 minutes The patient's level of consciousness and physiologic status were continuously monitored during the procedure by Radiology nursing. PROCEDURE: The procedure, risks, benefits, and alternatives were explained to the patient. Questions regarding the procedure were encouraged and answered. The patient understands and consents to the procedure. A time out was performed prior to initiating the procedure. CT was performed in a supine position followed by an oblique position with the left side raised. The left lower lateral abdominal wall was prepped with chlorhexidine in a sterile fashion, and a sterile drape was applied covering the operative field. A sterile gown and sterile gloves were used for the procedure. Local anesthesia was provided with 1% Lidocaine. Under CT guidance, an 18 gauge trocar needle was advanced into the left pelvic abscess. After confirming needle tip position, aspiration of fluid was performed. A fluid sample was sent for culture analysis. A  guidewire was advanced and the needle removed. The percutaneous tract was dilated and a 12 French percutaneous drainage catheter advanced over the wire. The catheter was formed and positioning confirmed by CT. The drainage catheter was then flushed with saline and connected to a suction bulb. The catheter was secured at the skin with a Prolene retention suture and StatLock device. COMPLICATIONS: None FINDINGS: Initially, there was not a good percutaneous window available to the left lower quadrant pelvic diverticular abscess in the supine position due to overlying bowel loops. With the patient rolled obliquely with the left side up, a more lateral approach did become available for safe percutaneous drainage. Aspiration at the level of the diverticular abscess yielded milky brown purulent fluid with a sample sent for culture  analysis. After drain placement, there is excellent return of thick fluid from the abscess cavity. IMPRESSION: CT-guided percutaneous catheter drainage of left pelvic sigmoid diverticular abscess. A sample of purulent fluid was sent for culture analysis. A 12 French drainage catheter was placed and attached to suction bulb drainage. Electronically Signed   By: Aletta Edouard M.D.   On: 08/12/2018 13:18        Scheduled Meds: . atorvastatin  80 mg Oral Daily  . docusate sodium  100 mg Oral BID  . enoxaparin (LOVENOX) injection  40 mg Subcutaneous Q24H  . ezetimibe  10 mg Oral Daily  . insulin aspart  0-9 Units Subcutaneous TID WC  . levothyroxine  125 mcg Oral Q0600  . mouth rinse  15 mL Mouth Rinse BID  . pantoprazole  40 mg Oral Daily  . sodium chloride flush  5 mL Intracatheter Q8H   Continuous Infusions: . sodium chloride 75 mL/hr at 08/13/18 0048  . meropenem (MERREM) IV 1 g (08/13/18 0640)     LOS: 8 days    Time spent: 26 minutes.     Hosie Poisson, MD [facility] Pager [phone]  If 7PM-7AM, please contact night-coverage [email or web address] Password [email or web address] 08/13/2018, 12:12 PM

## 2018-08-13 NOTE — Progress Notes (Signed)
Pharmacy Antibiotic Note  Kristie Allen is a 63 y.o. female admitted on 08/05/2018 with intra abdominal infection.  Pharmacy has been consulted to dose Merrem. Patient remains afebrile. Patient was recently in hospital. No surgery or steroids. No cultures. SCr 0.95 with CrCl ~80 mL/min.   Assessment: Afebrile, WBC slowly decreasing on Meropenem D12 total abx, D7 Meropenem. Abscess drain placed 4/26 Abscess Cx: pending, GNR on gram stain  Plan: Continue Merrem 1g IV every 8 hours. Monitor leukocytosis, clinical status, and renal function.   Height: 5\' 5"  (165.1 cm) Weight: 259 lb 7.7 oz (117.7 kg) IBW/kg (Calculated) : 57  Temp (24hrs), Avg:98.3 F (36.8 C), Min:97.8 F (36.6 C), Max:98.8 F (37.1 C)  Recent Labs  Lab 08/07/18 0301 08/08/18 0304 08/09/18 0433 08/10/18 0158 08/10/18 0941 08/11/18 0250 08/12/18 0317 08/13/18 0919  WBC 18.0* 15.5* 13.3* 14.0*  --  15.3* 14.5* 13.3*  CREATININE 0.95 0.73  --   --  0.46 0.50  --   --     Estimated Creatinine Clearance: 94.8 mL/min (by C-G formula based on SCr of 0.5 mg/dL).    Allergies  Allergen Reactions  . Bisphosphonates Other (See Comments)    Flu symptoms    Thank you for allowing pharmacy to be a part of this patient's care.  Minda Ditto PharmD Clinical Pharmacist Clinical phone for 08/13/2018 until 3p is [phone] 08/13/2018 11:55 AM  **Pharmacist phone directory can now be found on [email or web address] listed under [place]**

## 2018-08-13 NOTE — Progress Notes (Signed)
[facility]/Trauma Progress Note      Assessment/Plan HTN T2DM HLD Hypothyroidism Morbid Obesity - BMI 52 AKI   Acute sigmoid diverticulitis with microperforation and possible developing abscess - WBC14.5 04/26, afebrile -CT 04/25 showed enlarging L pelvic abscess anterior to L psoas muscle - S/P IR drain 04/26 - ideally this would resolve without acute surgical intervention, but if patient worsens or fails to improve may need resection - we will continue to follow  - recommend colonoscopy in 6-8 weeks   FEN:FLD VTE: SCDs, lovenox ID: Zosyn 4/19>4/21; Merrem 4/22>> Follow up: TBD  DISPO: pt improving since drain placement, will try FLD to see if she tolerates, CBC pending to check WBC, hopefully she will not need surgery this admission.     LOS: 8 days    Subjective: CC: abdominal pain, diverticulitis   Pt states she feels a lot better today since drain placement. Pain improved. She states no need for pain medicine overnight. No issues overnight. She is having flatus without increased pain. No nausea, vomiting, fever or chills. She didn't sleep well last night.   Objective: Vital signs in last 24 hours: Temp:  [97.8 F (36.6 C)-98.8 F (37.1 C)] 97.8 F (36.6 C) (04/27 0456) Pulse Rate:  [18-60] 60 (04/27 0456) Resp:  [16-18] 16 (04/26 1323) BP: (150-173)/(71-84) 157/77 (04/27 0456) SpO2:  [93 %-97 %] 96 % (04/27 0456) Last BM Date: 08/11/18  Intake/Output from previous day: 04/26 0701 - 04/27 0700 In: 5394.1 [P.O.:220; I.V.:4859.1; IV Piggyback:300] Out: 75 [Drains:75] Intake/Output this shift: No intake/output data recorded.  PE: Gen:  Alert, NAD, pleasant, cooperative Pulm:  Rate and effort normal Abd: Soft, ND, +BS, drain in LLQ with brown purulent drainage, very mild TTP in LLQ without guarding, no peritonitis  Skin: no rashes noted, warm and dry   Anti-infectives: Anti-infectives (From admission, onward)   Start     Dose/Rate  Route Frequency Ordered Stop   08/07/18 1300  meropenem (MERREM) 1 g in sodium chloride 0.9 % 100 mL IVPB     1 g 200 mL/hr over 30 Minutes Intravenous Every 8 hours 08/07/18 0952     08/05/18 0800  piperacillin-tazobactam (ZOSYN) IVPB 3.375 g  Status:  Discontinued     3.375 g 12.5 mL/hr over 240 Minutes Intravenous Every 8 hours 08/05/18 0508 08/07/18 0951   08/05/18 0130  piperacillin-tazobactam (ZOSYN) IVPB 3.375 g     3.375 g 100 mL/hr over 30 Minutes Intravenous  Once 08/05/18 0123 08/05/18 0211      Lab Results:  Recent Labs    08/11/18 0250 08/12/18 0317  WBC 15.3* 14.5*  HGB 12.3 11.9*  HCT 37.6 36.8  PLT 376 369   BMET Recent Labs    08/10/18 0941 08/11/18 0250  NA 141 140  K 3.8 3.7  CL 108 107  CO2 23 24  GLUCOSE 122* 113*  BUN 7* 6*  CREATININE 0.46 0.50  CALCIUM 8.4* 8.4*   PT/INR Recent Labs    08/11/18 0831  LABPROT 14.4  INR 1.1   CMP     Component Value Date/Time   NA 140 08/11/2018 0250   K 3.7 08/11/2018 0250   CL 107 08/11/2018 0250   CO2 24 08/11/2018 0250   GLUCOSE 113 (H) 08/11/2018 0250   BUN 6 (L) 08/11/2018 0250   CREATININE 0.50 08/11/2018 0250   CALCIUM 8.4 (L) 08/11/2018 0250   PROT 8.1 08/01/2018 2037   ALBUMIN 4.3 08/01/2018 2037   AST 19 08/01/2018 2037  ALT 33 08/01/2018 2037   ALKPHOS 79 08/01/2018 2037   BILITOT 1.2 08/01/2018 2037   GFRNONAA >60 08/11/2018 0250   GFRAA >60 08/11/2018 0250   Lipase     Component Value Date/Time   LIPASE 30 08/01/2018 2037    Studies/Results: Ct Image Guided Drainage By Percutaneous Catheter  Result Date: 08/12/2018 CLINICAL DATA:  Sigmoid diverticulitis with enlarging diverticular abscess. EXAM: CT GUIDED CATHETER DRAINAGE OF PERITONEAL ABSCESS ANESTHESIA/SEDATION: 2.0 mg IV Versed 100 mcg IV Fentanyl Total Moderate Sedation Time:  13 minutes The patient's level of consciousness and physiologic status were continuously monitored during the procedure by Radiology nursing.  PROCEDURE: The procedure, risks, benefits, and alternatives were explained to the patient. Questions regarding the procedure were encouraged and answered. The patient understands and consents to the procedure. A time out was performed prior to initiating the procedure. CT was performed in a supine position followed by an oblique position with the left side raised. The left lower lateral abdominal wall was prepped with chlorhexidine in a sterile fashion, and a sterile drape was applied covering the operative field. A sterile gown and sterile gloves were used for the procedure. Local anesthesia was provided with 1% Lidocaine. Under CT guidance, an 18 gauge trocar needle was advanced into the left pelvic abscess. After confirming needle tip position, aspiration of fluid was performed. A fluid sample was sent for culture analysis. A guidewire was advanced and the needle removed. The percutaneous tract was dilated and a 12 French percutaneous drainage catheter advanced over the wire. The catheter was formed and positioning confirmed by CT. The drainage catheter was then flushed with saline and connected to a suction bulb. The catheter was secured at the skin with a Prolene retention suture and StatLock device. COMPLICATIONS: None FINDINGS: Initially, there was not a good percutaneous window available to the left lower quadrant pelvic diverticular abscess in the supine position due to overlying bowel loops. With the patient rolled obliquely with the left side up, a more lateral approach did become available for safe percutaneous drainage. Aspiration at the level of the diverticular abscess yielded milky brown purulent fluid with a sample sent for culture analysis. After drain placement, there is excellent return of thick fluid from the abscess cavity. IMPRESSION: CT-guided percutaneous catheter drainage of left pelvic sigmoid diverticular abscess. A sample of purulent fluid was sent for culture analysis. A 12 French  drainage catheter was placed and attached to suction bulb drainage. Electronically Signed   By: Aletta Edouard M.D.   On: 08/12/2018 13:18      Kalman Drape , [facility] Surgery 08/13/2018, 9:05 AM  Pager: [phone] Mon-Wed, Friday 7:00am-4:30pm Thurs 7am-11:30am  Consults: [phone]

## 2018-08-13 NOTE — Progress Notes (Signed)
IR rounding note via telephone per new regulations. Spoke with Mendel Ryder, RN.  Patient with history of sigmoid diverticulitis with enlarging diverticular abscess s/p intraabdominal drain placement 08/12/2018 by Dr. Kathlene Cote.  RN reports drain site c/d/i with approximately 15 cc (since 0630 this AM) of milky blood tinged fluid in JP drain; reports drain flushes/aspirates without resistance.  Continue current drain management- continue with Qshift flushes/monitor of output. Appreciate and agree with CCS and [facility] management. IR to follow.  Bea Graff Boysie Bonebrake, PA-C 08/13/2018, 8:07 AM

## 2018-08-13 NOTE — Plan of Care (Signed)

## 2018-08-14 LAB — BASIC METABOLIC PANEL WITH GFR
Anion gap: 9 (ref 5–15)
BUN: <5 mg/dL — ABNORMAL LOW (ref 8–23)
CO2: 26 mmol/L (ref 22–32)
Calcium: 8.3 mg/dL — ABNORMAL LOW (ref 8.9–10.3)
Chloride: 107 mmol/L (ref 98–111)
Creatinine, Ser: 0.52 mg/dL (ref 0.44–1.00)
GFR calc Af Amer: >60 mL/min (ref >60)
GFR calc non Af Amer: >60 mL/min (ref >60)
Glucose, Bld: 105 mg/dL — ABNORMAL HIGH (ref 70–99)
Potassium: 3.5 mmol/L (ref 3.5–5.1)
Sodium: 142 mmol/L (ref 135–145)

## 2018-08-14 LAB — CBC
HCT: 39.8 % (ref 36.0–46.0)
Hemoglobin: 12.8 g/dL (ref 12.0–15.0)
MCH: 26.7 pg (ref 26.0–34.0)
MCHC: 32.2 g/dL (ref 30.0–36.0)
MCV: 82.9 fL (ref 80.0–100.0)
Platelets: 390 10*3/uL (ref 150–400)
RBC: 4.8 MIL/uL (ref 3.87–5.11)
RDW: 14 % (ref 11.5–15.5)
WBC: 12.3 10*3/uL — ABNORMAL HIGH (ref 4.0–10.5)
nRBC: 0 % (ref 0.0–0.2)

## 2018-08-14 LAB — GLUCOSE, CAPILLARY
Glucose-Capillary: 103 mg/dL — ABNORMAL HIGH (ref 70–99)
Glucose-Capillary: 107 mg/dL — ABNORMAL HIGH (ref 70–99)
Glucose-Capillary: 167 mg/dL — ABNORMAL HIGH (ref 70–99)
Glucose-Capillary: 180 mg/dL — ABNORMAL HIGH (ref 70–99)

## 2018-08-14 MED ORDER — POTASSIUM CHLORIDE 2 MEQ/ML IV SOLN
INTRAVENOUS | Status: DC
  Administered 2018-08-14 (×2): via INTRAVENOUS
  Filled 2018-08-14 (×5): qty 1000

## 2018-08-14 MED ORDER — SODIUM CHLORIDE 0.9 % IV SOLN
2.0000 g | INTRAVENOUS | Status: DC
  Administered 2018-08-14: 21:00:00 2 g via INTRAVENOUS
  Filled 2018-08-14 (×3): qty 20

## 2018-08-14 NOTE — Progress Notes (Signed)
PROGRESS NOTE    Kristie Allen  GHW:[id number] DOB: 01/31/1956 DOA: 08/05/2018 PCP: [name]., MD      Brief Narrative:  MRs. Zoeller is a 63 y.o. F with DM, HTN, and hypothyroidism who was recently admitted for uncomplicated diverticulitis, returned with LLQ abdominal pain, fever, and chills.    The ER, CT abdomen showed trace pneumoperitoneum, suspected microperforated sigmoid diverticulitis with pelvic abscess.  Renal surgery consulted.  Started on Zosyn.    Assessment & Plan:  Sigmoid diverticulitis with micro perforation, pelvic abscess General surgery consulted, started on Zosyn, IV fluids, bowel rest.    Patient had worsening abdominal pain, repeat CT showed enlarging abscess, anterior to the left psoas muscle.  Percutaneous drain placed by IR.  Cultures growing CTX-sensitive E coli.  WBC 12K today, only 15cc from drain [place]. -Narrow ABx to Ceftriaxone -Consult Surgery, appreciate cares -General surgery have advanced to soft diet  Diabetes -Hold home Synjardy  -Slight scale corrections  Hypertension Blood pressure normal -Hold home lisinopril, HCTZ  Hyperlipidemia -Continue Zetia, atorvastatin  Hypothyroidism -Continue levothyroxine  Other medications -Continue pantoprazole        MDM and disposition: The below labs and imaging reports were reviewed and summarized above.  Medication management as above.  The patient was admitted with diverticulitis failed outpatient therapy and developed abscess.  This has now improved with perc drain.  Likely home Weds or Thursday.          DVT prophylaxis: Lovenox Code Status: FULL Family Communication: None    Consultants:   General Surgery  Procedures:   Percutaneous drain  Antimicrobials:   Zosyn 4/18 >> 4/21  Merrem 4/21 >> 4/28  Ceftriaxone 4/28 >>    Subjective: No fever, vomiting, malaise. No chest pain, confusion.  No dyspnea.   Objective: Vitals:   08/13/18 1428 08/13/18  1916 08/14/18 0342 08/14/18 1400  BP: (!) 106/49 129/72 139/84 128/72  Pulse: 70 67 64 82  Resp: 18 16 14 18   Temp: 98 F (36.7 C) 98.5 F (36.9 C) 98.6 F (37 C) 98.6 F (37 C)  TempSrc: Oral Oral Oral Oral  SpO2: 99% 98% 93% 96%  Weight:      Height:        Intake/Output Summary (Last 24 hours) at 08/14/2018 1732 Last data filed at 08/14/2018 1300 Gross per 24 hour  Intake 30 ml  Output 15 ml  Net 15 ml   Filed Weights   08/05/18 0057  Weight: 117.7 kg    Examination: General appearance:  adult female, alert and in no acute distress.   HEENT: Anicteric, conjunctiva pink, lids and lashes normal. No nasal deformity, discharge, epistaxis.  Lips moist.   Skin: Warm and dry.  no jaundice.  No suspicious rashes or lesions. Cardiac: RRR, nl S1-S2, no murmurs appreciated.  Capillary refill is brisk.  JVP normal.  No LE edema.  Radial pulses 2+ and symmetric. Respiratory: Normal respiratory rate and rhythm.  CTAB without rales or wheezes. Abdomen: Abdomen soft.  Mild lower abdominal TTP, without gaurding or rebound. No ascites, distension, hepatosplenomegaly.   MSK: No deformities or effusions. Neuro: Awake and alert.  EOMI, moves all extremities. Speech fluent.    Psych: Sensorium intact and responding to questions, attention normal. Affect normal.  Judgment and insight appear normal.    Data Reviewed: I have personally reviewed following labs and imaging studies:  CBC: Recent Labs  Lab 08/10/18 0158 08/11/18 0250 08/12/18 0317 08/13/18 0919 08/14/18 0439  WBC 14.0* 15.3*  14.5* 13.3* 12.3*  HGB 11.9* 12.3 11.9* 13.0 12.8  HCT 36.6 37.6 36.8 40.2 39.8  MCV 83.2 82.1 82.5 82.5 82.9  PLT 336 376 369 401* 675   Basic Metabolic Panel: Recent Labs  Lab 08/08/18 0304 08/10/18 0941 08/11/18 0250 08/14/18 0439  NA 139 141 140 142  K 3.6 3.8 3.7 3.5  CL 106 108 107 107  CO2 20* 23 24 26   GLUCOSE 90 122* 113* 105*  BUN 13 7* 6* <5*  CREATININE 0.73 0.46 0.50 0.52   CALCIUM 8.3* 8.4* 8.4* 8.3*   GFR: Estimated Creatinine Clearance: 94.8 mL/min (by C-G formula based on SCr of 0.52 mg/dL). Liver Function Tests: No results for input(s): AST, ALT, ALKPHOS, BILITOT, PROT, ALBUMIN in the last 168 hours. No results for input(s): LIPASE, AMYLASE in the last 168 hours. No results for input(s): AMMONIA in the last 168 hours. Coagulation Profile: Recent Labs  Lab 08/11/18 0831  INR 1.1   Cardiac Enzymes: No results for input(s): CKTOTAL, CKMB, CKMBINDEX, TROPONINI in the last 168 hours. BNP (last 3 results) No results for input(s): PROBNP in the last 8760 hours. HbA1C: No results for input(s): HGBA1C in the last 72 hours. CBG: Recent Labs  Lab 08/13/18 1759 08/13/18 2101 08/14/18 0733 08/14/18 1243 08/14/18 1631  GLUCAP 126* 159* 103* 107* 167*   Lipid Profile: No results for input(s): CHOL, HDL, LDLCALC, TRIG, CHOLHDL, LDLDIRECT in the last 72 hours. Thyroid Function Tests: No results for input(s): TSH, T4TOTAL, FREET4, T3FREE, THYROIDAB in the last 72 hours. Anemia Panel: No results for input(s): VITAMINB12, FOLATE, FERRITIN, TIBC, IRON, RETICCTPCT in the last 72 hours. Urine analysis:    Component Value Date/Time   COLORURINE YELLOW 08/05/2018 0224   APPEARANCEUR CLEAR 08/05/2018 0224   LABSPEC 1.028 08/05/2018 0224   PHURINE 5.0 08/05/2018 0224   GLUCOSEU >=500 (A) 08/05/2018 0224   HGBUR NEGATIVE 08/05/2018 0224   BILIRUBINUR NEGATIVE 08/05/2018 0224   KETONESUR 5 (A) 08/05/2018 0224   PROTEINUR NEGATIVE 08/05/2018 0224   NITRITE NEGATIVE 08/05/2018 0224   LEUKOCYTESUR NEGATIVE 08/05/2018 0224   Sepsis Labs: @LABRCNTIP (procalcitonin:4,lacticacidven:4)  ) Recent Results (from the past 240 hour(s))  Aerobic/Anaerobic Culture (surgical/deep wound)     Status: None (Preliminary result)   Collection Time: 08/12/18 10:58 AM  Result Value Ref Range Status   Specimen Description ABSCESS  Final   Special Requests Normal  Final    Gram Stain   Final    MODERATE WBC PRESENT, PREDOMINANTLY PMN MODERATE GRAM NEGATIVE RODS Performed at [facility], [address], [place], [place] [address]    Culture   Final    ABUNDANT ESCHERICHIA COLI MIXED ANAEROBIC FLORA PRESENT.  CALL LAB IF FURTHER IID REQUIRED.    Report Status PENDING  Incomplete   Organism ID, Bacteria ESCHERICHIA COLI  Final      Susceptibility   Escherichia coli - MIC*    AMPICILLIN >=32 RESISTANT Resistant     CEFAZOLIN <=4 SENSITIVE Sensitive     CEFEPIME <=1 SENSITIVE Sensitive     CEFTAZIDIME <=1 SENSITIVE Sensitive     CEFTRIAXONE <=1 SENSITIVE Sensitive     CIPROFLOXACIN <=0.25 SENSITIVE Sensitive     GENTAMICIN <=1 SENSITIVE Sensitive     IMIPENEM <=0.25 SENSITIVE Sensitive     TRIMETH/SULFA <=20 SENSITIVE Sensitive     AMPICILLIN/SULBACTAM 16 INTERMEDIATE Intermediate     PIP/TAZO <=4 SENSITIVE Sensitive     Extended ESBL NEGATIVE Sensitive     * ABUNDANT ESCHERICHIA COLI  Radiology Studies: No results found.      Scheduled Meds: . atorvastatin  80 mg Oral Daily  . docusate sodium  100 mg Oral BID  . enoxaparin (LOVENOX) injection  40 mg Subcutaneous Q24H  . ezetimibe  10 mg Oral Daily  . insulin aspart  0-9 Units Subcutaneous TID WC  . levothyroxine  125 mcg Oral Q0600  . mouth rinse  15 mL Mouth Rinse BID  . pantoprazole  40 mg Oral Daily  . sodium chloride flush  5 mL Intracatheter Q8H   Continuous Infusions: . lactated ringers 1,000 mL with potassium chloride 20 mEq infusion 75 mL/hr at 08/14/18 1112  . meropenem (MERREM) IV 1 g (08/14/18 1228)     LOS: 9 days    Time spent: 25 minutes    Edwin Dada, MD [facility] 08/14/2018, 5:32 PM     Please page through [place]:  [email or web address] Password [email or web address] If 7PM-7AM, please contact night-coverage

## 2018-08-14 NOTE — Progress Notes (Signed)
IR rounding note via telephone per new regulations. Spoke with V, RN.  Patient with history of sigmoid diverticulitis with enlarging diverticular abscess s/p intraabdominal drain placement 08/12/2018 by Dr. Kathlene Cote.  RN reports drain site c/d/i with approximately 5 cc (since 0700 this AM; approximately 10 cc last evening so total of approximately 15 cc in 24 hours) of milky tan fluid in JP drain; reports drain flushes/aspirates without resistance.  Continue current drain management- continue with Qshift flushes/monitor of output. Appreciate and agree with CCS and [facility] management. IR to follow.  Bea Graff Rylie Limburg, PA-C 08/14/2018, 10:20 AM

## 2018-08-14 NOTE — Progress Notes (Signed)
Subjective: Alert and stable.  Afebrile.  Heart rate 64.  Denies pain or nausea.  A little bit anorexic.  Passing flatus and small loose stool.  No blood. Clearly feels better than she did 48 hours ago Left lower quadrant drain with watery cloudy light brown fluid, low volume WBC 12,300.  Hemoglobin 12.8.  Potassium 3.5.  Creatinine 0.52.  Objective: Vital signs in last 24 hours: Temp:  [98 F (36.7 C)-98.6 F (37 C)] 98.6 F (37 C) (04/28 0342) Pulse Rate:  [64-70] 64 (04/28 0342) Resp:  [14-18] 14 (04/28 0342) BP: (106-139)/(49-84) 139/84 (04/28 0342) SpO2:  [93 %-99 %] 93 % (04/28 0342) Last BM Date: 08/13/18  Intake/Output from previous day: 04/27 0701 - 04/28 0700 In: 745 [P.O.:720; I.V.:10] Out: 15 [Drains:15] Intake/Output this shift: No intake/output data recorded.  General appearance: Alert.  No distress.  Mental status normal. Resp: clear to auscultation bilaterally GI: Obese.  Nondistended.  Soft.  Not really tender.  Left lower quadrant drain site slightly tender but minimal low volume brownish watery cloudy drainage Extremities: extremities normal, atraumatic, no cyanosis or edema  Lab Results:  Recent Labs    08/13/18 0919 08/14/18 0439  WBC 13.3* 12.3*  HGB 13.0 12.8  HCT 40.2 39.8  PLT 401* 390   BMET Recent Labs    08/14/18 0439  NA 142  K 3.5  CL 107  CO2 26  GLUCOSE 105*  BUN <5*  CREATININE 0.52  CALCIUM 8.3*   PT/INR No results for input(s): LABPROT, INR in the last 72 hours. ABG No results for input(s): PHART, HCO3 in the last 72 hours.  Invalid input(s): PCO2, PO2  Studies/Results: Ct Image Guided Drainage By Percutaneous Catheter  Result Date: 08/12/2018 CLINICAL DATA:  Sigmoid diverticulitis with enlarging diverticular abscess. EXAM: CT GUIDED CATHETER DRAINAGE OF PERITONEAL ABSCESS ANESTHESIA/SEDATION: 2.0 mg IV Versed 100 mcg IV Fentanyl Total Moderate Sedation Time:  13 minutes The patient's level of consciousness and  physiologic status were continuously monitored during the procedure by Radiology nursing. PROCEDURE: The procedure, risks, benefits, and alternatives were explained to the patient. Questions regarding the procedure were encouraged and answered. The patient understands and consents to the procedure. A time out was performed prior to initiating the procedure. CT was performed in a supine position followed by an oblique position with the left side raised. The left lower lateral abdominal wall was prepped with chlorhexidine in a sterile fashion, and a sterile drape was applied covering the operative field. A sterile gown and sterile gloves were used for the procedure. Local anesthesia was provided with 1% Lidocaine. Under CT guidance, an 18 gauge trocar needle was advanced into the left pelvic abscess. After confirming needle tip position, aspiration of fluid was performed. A fluid sample was sent for culture analysis. A guidewire was advanced and the needle removed. The percutaneous tract was dilated and a 12 French percutaneous drainage catheter advanced over the wire. The catheter was formed and positioning confirmed by CT. The drainage catheter was then flushed with saline and connected to a suction bulb. The catheter was secured at the skin with a Prolene retention suture and StatLock device. COMPLICATIONS: None FINDINGS: Initially, there was not a good percutaneous window available to the left lower quadrant pelvic diverticular abscess in the supine position due to overlying bowel loops. With the patient rolled obliquely with the left side up, a more lateral approach did become available for safe percutaneous drainage. Aspiration at the level of the diverticular abscess yielded milky  brown purulent fluid with a sample sent for culture analysis. After drain placement, there is excellent return of thick fluid from the abscess cavity. IMPRESSION: CT-guided percutaneous catheter drainage of left pelvic sigmoid  diverticular abscess. A sample of purulent fluid was sent for culture analysis. A 12 French drainage catheter was placed and attached to suction bulb drainage. Electronically Signed   By: Aletta Edouard M.D.   On: 08/12/2018 13:18    Anti-infectives: Anti-infectives (From admission, onward)   Start     Dose/Rate Route Frequency Ordered Stop   08/07/18 1300  meropenem (MERREM) 1 g in sodium chloride 0.9 % 100 mL IVPB     1 g 200 mL/hr over 30 Minutes Intravenous Every 8 hours 08/07/18 0952     08/05/18 0800  piperacillin-tazobactam (ZOSYN) IVPB 3.375 g  Status:  Discontinued     3.375 g 12.5 mL/hr over 240 Minutes Intravenous Every 8 hours 08/05/18 0508 08/07/18 0951   08/05/18 0130  piperacillin-tazobactam (ZOSYN) IVPB 3.375 g     3.375 g 100 mL/hr over 30 Minutes Intravenous  Once 08/05/18 0123 08/05/18 0211      Assessment/Plan:   HTN T2DM HLD Hypothyroidism Morbid Obesity - BMI 43 AKI   Acute sigmoid diverticulitis with microperforation and possible developing abscess - WBC down a little., afebrile -CT 04/25 showed enlarging L pelvic abscess anterior to L psoas muscle - S/P IRdrain 04/26 -Likely this will resolve with medical management -Advance diet from full liquids to soft diet as tolerated -Continue IV antibiotics -Hopefully she will be able to go home in 1 to 2 days - recommend colonoscopy in 6-8 weeks   FEN:FLD VTE: SCDs, lovenox ID: Zosyn 4/19>4/21; Merrem 4/22>> Follow up: TBD  DISPO:  Continues to improve.  Advance diet as tolerated.  Continue IV antibiotics.  Encourage frequent ambulation..    LOS: 9 days    Adin Hector 08/14/2018

## 2018-08-14 NOTE — Progress Notes (Signed)
Received report from [place]. Pt walking in halls and was cleaning up during rounds. No signs of Respiratory distress. No c/o pain. Will continue to monitor.

## 2018-08-15 DIAGNOSIS — K630 Abscess of intestine: Secondary | ICD-10-CM

## 2018-08-15 LAB — BASIC METABOLIC PANEL
Anion gap: 9 (ref 5–15)
BUN: <5 mg/dL — ABNORMAL LOW (ref 8–23)
CO2: 25 mmol/L (ref 22–32)
Calcium: 8.6 mg/dL — ABNORMAL LOW (ref 8.9–10.3)
Chloride: 108 mmol/L (ref 98–111)
Creatinine, Ser: 0.46 mg/dL (ref 0.44–1.00)
GFR calc Af Amer: >60 mL/min (ref >60)
GFR calc non Af Amer: >60 mL/min (ref >60)
Glucose, Bld: 99 mg/dL (ref 70–99)
Potassium: 3.6 mmol/L (ref 3.5–5.1)
Sodium: 142 mmol/L (ref 135–145)

## 2018-08-15 LAB — GLUCOSE, CAPILLARY
Glucose-Capillary: 99 mg/dL (ref 70–99)
Glucose-Capillary: 137 mg/dL — ABNORMAL HIGH (ref 70–99)

## 2018-08-15 LAB — CBC
HCT: 38.1 % (ref 36.0–46.0)
Hemoglobin: 12.2 g/dL (ref 12.0–15.0)
MCH: 26.9 pg (ref 26.0–34.0)
MCHC: 32 g/dL (ref 30.0–36.0)
MCV: 84.1 fL (ref 80.0–100.0)
Platelets: 399 10*3/uL (ref 150–400)
RBC: 4.53 MIL/uL (ref 3.87–5.11)
RDW: 14.3 % (ref 11.5–15.5)
WBC: 10.9 10*3/uL — ABNORMAL HIGH (ref 4.0–10.5)
nRBC: 0 % (ref 0.0–0.2)

## 2018-08-15 MED ORDER — METRONIDAZOLE 500 MG PO TABS: 500.0000 mg | ORAL_TABLET | Freq: Three times a day (TID) | ORAL | 0 refills | Status: AC

## 2018-08-15 MED ORDER — SODIUM CHLORIDE 0.9 % IJ SOLN: 10.0000 mL | Freq: Every day | INTRAMUSCULAR | 1 refills | Status: DC

## 2018-08-15 MED ORDER — TRAMADOL HCL 50 MG PO TABS: 50.0000 mg | ORAL_TABLET | Freq: Four times a day (QID) | ORAL | 0 refills | Status: DC | PRN

## 2018-08-15 MED ORDER — CEPHALEXIN 500 MG PO CAPS: 500.0000 mg | ORAL_CAPSULE | Freq: Four times a day (QID) | ORAL | 0 refills | Status: AC

## 2018-08-15 NOTE — Progress Notes (Signed)
Patient with history of sigmoid diverticulitis with enlarging diverticular abscess s/p intraabdominal drain placement 08/12/2018 by Dr. Kathlene Cote.  Patient for discharge today per [facility]. Patient to flush drain once daily with 10 cc NS flush- prescription eprescribed to pharmacy. 6N RN to show patient how to flush drain. Drain output monitoring cards given to patient. Plan to follow-up at drain clinic in 2 weeks for CT and drain injection (order placed for this).  Please call IR with questions/concerns.  Bea Graff [place], PA-C 08/15/2018, 11:41 AM

## 2018-08-15 NOTE — Discharge Summary (Signed)
Physician Discharge Summary  Kristie Allen JXB:[id number] DOB: Mar 14, 1956 DOA: 08/05/2018  PCP: [name]., MD  Admit date: 08/05/2018 Discharge date: 08/15/2018  Admitted From: Inpatient Disposition: home  Recommendations for Outpatient Follow-up:  1. Follow up with PCP in 1-2 weeks and IR in 2 weeks 2. Please obtain BMP/CBC in one week 3. Please follow up on the following pending results:  Home Health:No Equipment/Devices:none  Discharge Condition:Stable CODE STATUS:Full code Diet recommendation: Diabetic diet  Brief/Interim Summary: This is a 63 year old white female with a history of diabetes, hypertension and hypothyroidism who was recently admitted for uncomplicated diverticulitis.  She returned with left lower quadrant abdominal pain fever and chills.  In the ER patient had a CT of abdomen which showed trace pneumoperitoneum which was concerning for suspected microperforated sigmoid diverticulitis with pelvic abscess.  The hospital service as well as general surgery were consulted.  Patient was started on Zosyn and admitted to the hospital service for further eval and treatment.  Hospital course as follows:  Sigmoid diverticulitis with microperforation and pelvic abscess.  Patient was seen by general surgery.  Patient subsequently seen by IR with a percutaneous drain placed.  Cultures were growing E. coli.  Review of culture data and discussion with surgical team as well as pharmacy recommended Flagyl and Keflex for 2 weeks.  Clinically patient improved her drain only put out 15 cc each of the last 2 days.  I discussed case with IR who indicated patient is to be discharged with drain and she is to flush the drain once daily with 10 cc normal saline, monitor drain output, and follow-up with a drain clinic in 2 weeks for CT and drain injection.  Clinically patient improved her white blood cell count is improved.  While in the hospital to continue treating her diabetes, hypertension,  hyperlipidemia hypothyroidism with sliding scale insulin, her antihypertensive medications, study and atorvastatin for hyperlipidemia, Synthroid for hypothyroidism, and we also continued a PPI for her GERD.    Patient is stable for discharge home plan of care discussed with patient she is in agreement with plan of care with follow-up as outlined above.  Discharge Diagnoses:  Principal Problem:   Diverticulitis of large intestine with perforation Active Problems:   DM2 (diabetes mellitus, type 2) ([place])   HLD (hyperlipidemia)   HTN (hypertension)    Discharge Instructions  Discharge Instructions    Call MD for:   Complete by:  As directed    For any acute change in medical condition to include but not limited to nausea vomiting fevers chills chest pain shortness of breath   Diet - low sodium heart healthy   Complete by:  As directed    Discharge instructions   Complete by:  As directed    Wound/drain care and f/up per IR   Increase activity slowly   Complete by:  As directed      Allergies as of 08/15/2018      Reactions   Bisphosphonates Other (See Comments)   Flu symptoms      Medication List    STOP taking these medications   amoxicillin-clavulanate 875-125 MG tablet Commonly known as:  AUGMENTIN     TAKE these medications   aspirin EC 81 MG tablet Take 81 mg by mouth daily.   atorvastatin 80 MG tablet Commonly known as:  LIPITOR Take 80 mg by mouth daily.   cephALEXin 500 MG capsule Commonly known as:  KEFLEX Take 1 capsule (500 mg total) by mouth 4 (four)  times daily for 14 days.   cholecalciferol 25 MCG (1000 UT) tablet Commonly known as:  VITAMIN D3 Take 1,000 Units by mouth daily.   ezetimibe 10 MG tablet Commonly known as:  ZETIA Take 10 mg by mouth daily.   levothyroxine 125 MCG tablet Commonly known as:  SYNTHROID Take 125 mcg by mouth daily before breakfast.   lisinopril-hydrochlorothiazide 20-12.5 MG tablet Commonly known as:   ZESTORETIC Take 1 tablet by mouth daily.   magnesium 30 MG tablet Take 30 mg by mouth daily.   meloxicam 7.5 MG tablet Commonly known as:  MOBIC Take 7.5 mg by mouth daily as needed for pain.   metroNIDAZOLE 0.75 % cream Commonly known as:  METROCREAM Apply 1 application topically 2 (two) times daily.   metroNIDAZOLE 500 MG tablet Commonly known as:  Flagyl Take 1 tablet (500 mg total) by mouth 3 (three) times daily for 14 days.   pantoprazole 40 MG tablet Commonly known as:  PROTONIX Take 40 mg by mouth daily.   senna 8.6 MG Tabs tablet Commonly known as:  SENOKOT Take 1 tablet (8.6 mg total) by mouth daily as needed for mild constipation.   sodium chloride 0.9 % injection Inject 10 mLs into the vein daily. Inject 10 cc into drain once daily.   Synjardy XR 12.08-998 MG Tb24 Generic drug:  Empagliflozin-metFORMIN HCl ER Take 1 tablet by mouth daily.   traMADol 50 MG tablet Commonly known as:  ULTRAM Take 1 tablet (50 mg total) by mouth every 6 (six) hours as needed for moderate pain.      Follow-up Information    [name], MD. Go on 09/04/2018.   Specialty:  General Surgery Why:  Appointment scheduled for 10:45 Contact information: [place] [address] [phone]          Allergies  Allergen Reactions  . Bisphosphonates Other (See Comments)    Flu symptoms    Consultations:  General surgery, interventional radiology   Procedures/Studies: Ct Abdomen Pelvis W Contrast  Result Date: 08/10/2018 CLINICAL DATA:  Abdominal infection EXAM: CT ABDOMEN AND PELVIS WITH CONTRAST TECHNIQUE: Multidetector CT imaging of the abdomen and pelvis was performed using the standard protocol following bolus administration of intravenous contrast. CONTRAST:  178mL OMNIPAQUE IOHEXOL 300 MG/ML  SOLN COMPARISON:  None. FINDINGS: Lower chest: Small right pleural effusion. Bibasilar atelectasis. Heart is normal size. No focal hepatic abnormality.   Gallbladder unremarkable. Hepatobiliary: No focal hepatic abnormality. Gallbladder unremarkable. Pancreas: No focal abnormality or ductal dilatation. Spleen: No focal abnormality.  Normal size. Adrenals/Urinary Tract: mild left hydronephrosis, new since prior study. No hydronephrosis on the right. Adrenal glands and urinary bladder unremarkable. No focal renal abnormality. Stomach/Bowel: Sigmoid diverticulosis. Inflammatory stranding noted with fluid collection again noted anterior to the left psoas muscle with air-fluid level. This measures 4.5 x 3.7 cm compared with 2.6 cm maximally previously. No evidence of bowel obstruction. Stomach and small bowel decompressed, grossly unremarkable. Vascular/Lymphatic: Scattered calcifications in the aorta. No evidence of aneurysm or adenopathy. Reproductive: Uterus and adnexa unremarkable.  No mass. Other: Small amount of free fluid in the pelvis. A few locules of extraluminal gas again noted adjacent to the sigmoid colon, similar to prior study. Musculoskeletal: No acute bony abnormality. IMPRESSION: Changes of sigmoid diverticulitis. Few locules of extraluminal gas and stranding in the left lower quadrant. Enlarging abscess in the left lower abdomen anterior to the left psoas muscle, now measuring up to 4.5 cm compared with 2.6 cm previously. This  appears to be the cause of mild left hydronephrosis which is new since prior study, likely due to mass effect on the left ureter. Small amount of free fluid in the pelvis. Small right pleural effusion.  Bibasilar atelectasis. Electronically Signed   By: Rolm Baptise M.D.   On: 08/10/2018 21:21   Ct Abdomen Pelvis W Contrast  Result Date: 08/05/2018 CLINICAL DATA:  63 y/o F; lower abdominal pain with increasing severity of the right lower quadrant. EXAM: CT ABDOMEN AND PELVIS WITH CONTRAST TECHNIQUE: Multidetector CT imaging of the abdomen and pelvis was performed using the standard protocol following bolus administration of  intravenous contrast. CONTRAST:  173mL OMNIPAQUE IOHEXOL 300 MG/ML  SOLN COMPARISON:  None. FINDINGS: Lower chest: Small hiatal hernia. Hepatobiliary: No focal liver abnormality is seen. No gallstones, gallbladder wall thickening, or biliary dilatation. Pancreas: Unremarkable. No pancreatic ductal dilatation or surrounding inflammatory changes. Spleen: Normal in size without focal abnormality. Adrenals/Urinary Tract: Adrenal glands are unremarkable. Kidneys are normal, without renal calculi, focal lesion, or hydronephrosis. Bladder is unremarkable. Stomach/Bowel: Acute perforated sigmoid diverticulitis. Trace volume of pneumoperitoneum. Small volume of free fluid within the pelvis and left lower quadrant as well as a 26 mm collection anterior to the psoas muscle at pelvic brim (series 3, image 62). No additional obstructive or inflammatory changes of the small or large bowel. Vascular/Lymphatic: Aortic atherosclerosis. No enlarged abdominal or pelvic lymph nodes. Reproductive: Uterus and bilateral adnexa are unremarkable. Other: No abdominal wall hernia or abnormality. Musculoskeletal: No fracture is seen. IMPRESSION: 1. Acute perforated sigmoid diverticulitis. Trace pneumoperitoneum small volume free fluid within pelvis and left lower quadrant. 2. 26 mm discrete fluid collection anterior to the left psoas muscle at pelvic brim, probably a developing abscess. 3. Small hiatal hernia. 4. Aortic Atherosclerosis (ICD10-I70.0). Electronically Signed   By: Kristine Garbe M.D.   On: 08/05/2018 03:28   Ct Abdomen Pelvis W Contrast  Result Date: 08/01/2018 CLINICAL DATA:  63 year old female with abdominal pain and vomit EXAM: CT ABDOMEN AND PELVIS WITH CONTRAST TECHNIQUE: Multidetector CT imaging of the abdomen and pelvis was performed using the standard protocol following bolus administration of intravenous contrast. CONTRAST:  146mL OMNIPAQUE IOHEXOL 300 MG/ML  SOLN COMPARISON:  None. FINDINGS: Lower chest:  No acute finding Hepatobiliary: Unremarkable liver.  Unremarkable gallbladder Pancreas: Unremarkable Spleen: Unremarkable Adrenals/Urinary Tract: Unremarkable appearance of the adrenal glands. No evidence of hydronephrosis of the right or left kidney. No nephrolithiasis. Unremarkable course of the bilateral ureters. Unremarkable appearance of the urinary bladder. Stomach/Bowel: Unremarkable stomach. Hiatal hernia. Small bowel without distention or transition point. Normal appendix. Moderate stool burden of the proximal colon. Circumferential inflammatory changes in the proximal segment of sigmoid colon. This is in a segment of significant diverticular change. Inflammatory changes of the adjacent fat. Free fluid on the anti mesenteric border of the colon, with fluid layering dependently in the pelvis. No free air or abscess. Vascular/Lymphatic: Mild atherosclerotic changes. No aneurysm. Bilateral iliac arteries and proximal femoral arteries patent. Reproductive: Unremarkable uterus Other: None Musculoskeletal: No displaced fracture. Degenerative changes of L5-S1. IMPRESSION: Acute diverticulitis, uncomplicated. Hiatal hernia. Electronically Signed   By: Corrie Mckusick D.O.   On: 08/01/2018 21:45   Ct Image Guided Drainage By Percutaneous Catheter  Result Date: 08/12/2018 CLINICAL DATA:  Sigmoid diverticulitis with enlarging diverticular abscess. EXAM: CT GUIDED CATHETER DRAINAGE OF PERITONEAL ABSCESS ANESTHESIA/SEDATION: 2.0 mg IV Versed 100 mcg IV Fentanyl Total Moderate Sedation Time:  13 minutes The patient's level of consciousness and physiologic status were continuously  monitored during the procedure by Radiology nursing. PROCEDURE: The procedure, risks, benefits, and alternatives were explained to the patient. Questions regarding the procedure were encouraged and answered. The patient understands and consents to the procedure. A time out was performed prior to initiating the procedure. CT was performed in a  supine position followed by an oblique position with the left side raised. The left lower lateral abdominal wall was prepped with chlorhexidine in a sterile fashion, and a sterile drape was applied covering the operative field. A sterile gown and sterile gloves were used for the procedure. Local anesthesia was provided with 1% Lidocaine. Under CT guidance, an 18 gauge trocar needle was advanced into the left pelvic abscess. After confirming needle tip position, aspiration of fluid was performed. A fluid sample was sent for culture analysis. A guidewire was advanced and the needle removed. The percutaneous tract was dilated and a 12 French percutaneous drainage catheter advanced over the wire. The catheter was formed and positioning confirmed by CT. The drainage catheter was then flushed with saline and connected to a suction bulb. The catheter was secured at the skin with a Prolene retention suture and StatLock device. COMPLICATIONS: None FINDINGS: Initially, there was not a good percutaneous window available to the left lower quadrant pelvic diverticular abscess in the supine position due to overlying bowel loops. With the patient rolled obliquely with the left side up, a more lateral approach did become available for safe percutaneous drainage. Aspiration at the level of the diverticular abscess yielded milky brown purulent fluid with a sample sent for culture analysis. After drain placement, there is excellent return of thick fluid from the abscess cavity. IMPRESSION: CT-guided percutaneous catheter drainage of left pelvic sigmoid diverticular abscess. A sample of purulent fluid was sent for culture analysis. A 12 French drainage catheter was placed and attached to suction bulb drainage. Electronically Signed   By: Aletta Edouard M.D.   On: 08/12/2018 13:18       Subjective: Patient reports feeling well pain is well controlled, no back pain, felt she is ready to be discharged home if cleared medically,  patient's daughter was on the phone discussed plan of care with both patient and daughter who are in agreement with plan of care.  Discharge Exam: Vitals:   08/14/18 1400 08/15/18 0503  BP: 128/72 (!) 143/83  Pulse: 82 (!) 54  Resp: 18 16  Temp: 98.6 F (37 C) 98.3 F (36.8 C)  SpO2: 96% 97%   Vitals:   08/13/18 1916 08/14/18 0342 08/14/18 1400 08/15/18 0503  BP: 129/72 139/84 128/72 (!) 143/83  Pulse: 67 64 82 (!) 54  Resp: 16 14 18 16   Temp: 98.5 F (36.9 C) 98.6 F (37 C) 98.6 F (37 C) 98.3 F (36.8 C)  TempSrc: Oral Oral Oral Oral  SpO2: 98% 93% 96% 97%  Weight:      Height:        General: Pt is alert, awake, not in acute distress Cardiovascular: RRR, S1/S2 +, no rubs, no gallops Respiratory: CTA bilaterally, no wheezing, no rhonchi Abdominal: Soft, NT, ND, bowel sounds + drain in place on left flank, no output at site of drain, no bleeding no pus.  Drain bulb with scant fluid serosanguineous Extremities: no edema, no cyanosis    The results of significant diagnostics from this hospitalization (including imaging, microbiology, ancillary and laboratory) are listed below for reference.     Microbiology: Recent Results (from the past 240 hour(s))  Aerobic/Anaerobic Culture (surgical/deep wound)  Status: None (Preliminary result)   Collection Time: 08/12/18 10:58 AM  Result Value Ref Range Status   Specimen Description ABSCESS  Final   Special Requests Normal  Final   Gram Stain   Final    MODERATE WBC PRESENT, PREDOMINANTLY PMN MODERATE GRAM NEGATIVE RODS Performed at [facility], [address], [place], [place] [address]    Culture   Final    ABUNDANT ESCHERICHIA COLI MIXED ANAEROBIC FLORA PRESENT.  CALL LAB IF FURTHER IID REQUIRED.    Report Status PENDING  Incomplete   Organism ID, Bacteria ESCHERICHIA COLI  Final      Susceptibility   Escherichia coli - MIC*    AMPICILLIN >=32 RESISTANT Resistant     CEFAZOLIN <=4 SENSITIVE Sensitive      CEFEPIME <=1 SENSITIVE Sensitive     CEFTAZIDIME <=1 SENSITIVE Sensitive     CEFTRIAXONE <=1 SENSITIVE Sensitive     CIPROFLOXACIN <=0.25 SENSITIVE Sensitive     GENTAMICIN <=1 SENSITIVE Sensitive     IMIPENEM <=0.25 SENSITIVE Sensitive     TRIMETH/SULFA <=20 SENSITIVE Sensitive     AMPICILLIN/SULBACTAM 16 INTERMEDIATE Intermediate     PIP/TAZO <=4 SENSITIVE Sensitive     Extended ESBL NEGATIVE Sensitive     * ABUNDANT ESCHERICHIA COLI     Labs: BNP (last 3 results) No results for input(s): BNP in the last 8760 hours. Basic Metabolic Panel: Recent Labs  Lab 08/10/18 0941 08/11/18 0250 08/14/18 0439 08/15/18 0411  NA 141 140 142 142  K 3.8 3.7 3.5 3.6  CL 108 107 107 108  CO2 23 24 26 25   GLUCOSE 122* 113* 105* 99  BUN 7* 6* <5* <5*  CREATININE 0.46 0.50 0.52 0.46  CALCIUM 8.4* 8.4* 8.3* 8.6*   Liver Function Tests: No results for input(s): AST, ALT, ALKPHOS, BILITOT, PROT, ALBUMIN in the last 168 hours. No results for input(s): LIPASE, AMYLASE in the last 168 hours. No results for input(s): AMMONIA in the last 168 hours. CBC: Recent Labs  Lab 08/11/18 0250 08/12/18 0317 08/13/18 0919 08/14/18 0439 08/15/18 0411  WBC 15.3* 14.5* 13.3* 12.3* 10.9*  HGB 12.3 11.9* 13.0 12.8 12.2  HCT 37.6 36.8 40.2 39.8 38.1  MCV 82.1 82.5 82.5 82.9 84.1  PLT 376 369 401* 390 399   Cardiac Enzymes: No results for input(s): CKTOTAL, CKMB, CKMBINDEX, TROPONINI in the last 168 hours. BNP: Invalid input(s): POCBNP CBG: Recent Labs  Lab 08/14/18 1243 08/14/18 1631 08/14/18 2107 08/15/18 0722 08/15/18 1234  GLUCAP 107* 167* 180* 99 137*   D-Dimer No results for input(s): DDIMER in the last 72 hours. Hgb A1c No results for input(s): HGBA1C in the last 72 hours. Lipid Profile No results for input(s): CHOL, HDL, LDLCALC, TRIG, CHOLHDL, LDLDIRECT in the last 72 hours. Thyroid function studies No results for input(s): TSH, T4TOTAL, T3FREE, THYROIDAB in the last 72  hours.  Invalid input(s): FREET3 Anemia work up No results for input(s): VITAMINB12, FOLATE, FERRITIN, TIBC, IRON, RETICCTPCT in the last 72 hours. Urinalysis    Component Value Date/Time   COLORURINE YELLOW 08/05/2018 0224   APPEARANCEUR CLEAR 08/05/2018 0224   LABSPEC 1.028 08/05/2018 0224   PHURINE 5.0 08/05/2018 0224   GLUCOSEU >=500 (A) 08/05/2018 0224   HGBUR NEGATIVE 08/05/2018 0224   BILIRUBINUR NEGATIVE 08/05/2018 0224   KETONESUR 5 (A) 08/05/2018 0224   PROTEINUR NEGATIVE 08/05/2018 0224   NITRITE NEGATIVE 08/05/2018 0224   LEUKOCYTESUR NEGATIVE 08/05/2018 0224   Sepsis Labs Invalid input(s): PROCALCITONIN,  WBC,  LACTICIDVEN Microbiology Recent Results (from the past 240 hour(s))  Aerobic/Anaerobic Culture (surgical/deep wound)     Status: None (Preliminary result)   Collection Time: 08/12/18 10:58 AM  Result Value Ref Range Status   Specimen Description ABSCESS  Final   Special Requests Normal  Final   Gram Stain   Final    MODERATE WBC PRESENT, PREDOMINANTLY PMN MODERATE GRAM NEGATIVE RODS Performed at [facility], [address] [place], [place] [address]    Culture   Final    ABUNDANT ESCHERICHIA COLI MIXED ANAEROBIC FLORA PRESENT.  CALL LAB IF FURTHER IID REQUIRED.    Report Status PENDING  Incomplete   Organism ID, Bacteria ESCHERICHIA COLI  Final      Susceptibility   Escherichia coli - MIC*    AMPICILLIN >=32 RESISTANT Resistant     CEFAZOLIN <=4 SENSITIVE Sensitive     CEFEPIME <=1 SENSITIVE Sensitive     CEFTAZIDIME <=1 SENSITIVE Sensitive     CEFTRIAXONE <=1 SENSITIVE Sensitive     CIPROFLOXACIN <=0.25 SENSITIVE Sensitive     GENTAMICIN <=1 SENSITIVE Sensitive     IMIPENEM <=0.25 SENSITIVE Sensitive     TRIMETH/SULFA <=20 SENSITIVE Sensitive     AMPICILLIN/SULBACTAM 16 INTERMEDIATE Intermediate     PIP/TAZO <=4 SENSITIVE Sensitive     Extended ESBL NEGATIVE Sensitive     * ABUNDANT ESCHERICHIA COLI     Time coordinating  discharge: 35 minutes  SIGNED:   Nicolette Bang, MD  [facility] 08/15/2018, 1:03 PM Pager   If 7PM-7AM, please contact night-coverage [email or web address] Password [email or web address]

## 2018-08-15 NOTE — Progress Notes (Signed)
Kristie Allen to be D/C'd  per MD order. Discussed with the patient and all questions fully answered.  VSS, Skin clean, dry and intact without evidence of skin break down, no evidence of skin tears noted.  IV catheter discontinued intact. Site without signs and symptoms of complications. Dressing and pressure applied.  An After Visit Summary was printed and given to the patient. Patient received prescription.  D/c education completed with patient/family including follow up instructions, medication list, d/c activities limitations if indicated, with other d/c instructions as indicated by MD - patient able to verbalize understanding, all questions fully answered.   Patient instructed to return to ED, call 911, or call MD for any changes in condition.   Patient to be escorted via [place], and D/C home via private auto.

## 2018-08-15 NOTE — Plan of Care (Signed)

## 2018-08-15 NOTE — Progress Notes (Addendum)
[facility]/Trauma Progress Note      Assessment/Plan HTN T2DM HLD Hypothyroidism Morbid Obesity - BMI 43 AKI   Acute sigmoid diverticulitis with microperforation and possible developing abscess -CT04/25showed enlarging L pelvic abscess anterior to L psoas muscle - S/PIRdrain 04/26 - WBC down to 10.9, afebrile - recommend colonoscopy in 6-8 weeks   EXB:MWUX diet VTE: SCDs, lovenox ID: Zosyn 4/19>4/21; Merrem 4/22>> Follow up:Dr. Dema Severin  DISPO: Continues to improve.  okay for discharge from our standpoint. 2 more weeks abx Augmentin preferred    LOS: 10 days    Subjective: CC: diverticulitis   No pain. No nausea, vomiting, fever, chills, or new issues overnight. Tolerating diet. Having loose stools without blood. Tolerating diet.   Objective: Vital signs in last 24 hours: Temp:  [98.3 F (36.8 C)-98.6 F (37 C)] 98.3 F (36.8 C) (04/29 0503) Pulse Rate:  [54-82] 54 (04/29 0503) Resp:  [16-18] 16 (04/29 0503) BP: (128-143)/(72-83) 143/83 (04/29 0503) SpO2:  [96 %-97 %] 97 % (04/29 0503) Last BM Date: 08/15/18  Intake/Output from previous day: 04/28 0701 - 04/29 0700 In: 1986 [P.O.:785; I.V.:1081; IV Piggyback:100] Out: 15 [Drains:15] Intake/Output this shift: No intake/output data recorded.  PE: Gen:  Alert, NAD, pleasant, cooperative Pulm:  Rate and effort normal Abd: Soft, ND, +BS, drain in LLQ with thinner tan cloudy drainage, very mild TTP in LLQ around drain without guarding, no peritonitis  Skin: no rashes noted, warm and dry   Anti-infectives: Anti-infectives (From admission, onward)   Start     Dose/Rate Route Frequency Ordered Stop   08/14/18 2000  cefTRIAXone (ROCEPHIN) 2 g in sodium chloride 0.9 % 100 mL IVPB     2 g 200 mL/hr over 30 Minutes Intravenous Every 24 hours 08/14/18 1737     08/07/18 1300  meropenem (MERREM) 1 g in sodium chloride 0.9 % 100 mL IVPB  Status:  Discontinued     1 g 200 mL/hr over 30 Minutes  Intravenous Every 8 hours 08/07/18 0952 08/14/18 1737   08/05/18 0800  piperacillin-tazobactam (ZOSYN) IVPB 3.375 g  Status:  Discontinued     3.375 g 12.5 mL/hr over 240 Minutes Intravenous Every 8 hours 08/05/18 0508 08/07/18 0951   08/05/18 0130  piperacillin-tazobactam (ZOSYN) IVPB 3.375 g     3.375 g 100 mL/hr over 30 Minutes Intravenous  Once 08/05/18 0123 08/05/18 0211      Lab Results:  Recent Labs    08/14/18 0439 08/15/18 0411  WBC 12.3* 10.9*  HGB 12.8 12.2  HCT 39.8 38.1  PLT 390 399   BMET Recent Labs    08/14/18 0439 08/15/18 0411  NA 142 142  K 3.5 3.6  CL 107 108  CO2 26 25  GLUCOSE 105* 99  BUN <5* <5*  CREATININE 0.52 0.46  CALCIUM 8.3* 8.6*   PT/INR No results for input(s): LABPROT, INR in the last 72 hours. CMP     Component Value Date/Time   NA 142 08/15/2018 0411   K 3.6 08/15/2018 0411   CL 108 08/15/2018 0411   CO2 25 08/15/2018 0411   GLUCOSE 99 08/15/2018 0411   BUN <5 (L) 08/15/2018 0411   CREATININE 0.46 08/15/2018 0411   CALCIUM 8.6 (L) 08/15/2018 0411   PROT 8.1 08/01/2018 2037   ALBUMIN 4.3 08/01/2018 2037   AST 19 08/01/2018 2037   ALT 33 08/01/2018 2037   ALKPHOS 79 08/01/2018 2037   BILITOT 1.2 08/01/2018 2037   GFRNONAA >60 08/15/2018 0411  GFRAA >60 08/15/2018 0411   Lipase     Component Value Date/Time   LIPASE 30 08/01/2018 2037    Studies/Results: No results found.    Kristie Allen , [facility] 08/15/2018, 8:44 AM  Pager: [phone] Mon-Wed, Friday 7:00am-4:30pm Thurs 7am-11:30am  Consults: [phone]

## 2018-08-17 LAB — AEROBIC/ANAEROBIC CULTURE W GRAM STAIN (SURGICAL/DEEP WOUND): Special Requests: NORMAL

## 2018-08-20 ENCOUNTER — Other Ambulatory Visit: Payer: Self-pay | Admitting: Surgery

## 2018-08-20 DIAGNOSIS — K5720 Diverticulitis of large intestine with perforation and abscess without bleeding: Secondary | ICD-10-CM

## 2018-08-29 ENCOUNTER — Other Ambulatory Visit: Payer: PRIVATE HEALTH INSURANCE

## 2018-08-29 ENCOUNTER — Ambulatory Visit
Admission: RE | Admit: 2018-08-29 | Discharge: 2018-08-29 | Disposition: A | Discharge status: Home / Self Care | Payer: PRIVATE HEALTH INSURANCE | Source: Ambulatory Visit | Attending: Surgery | Admitting: Surgery

## 2018-08-29 ENCOUNTER — Ambulatory Visit
Admission: RE | Admit: 2018-08-29 | Discharge: 2018-08-29 | Disposition: A | Discharge status: Home / Self Care | Payer: PRIVATE HEALTH INSURANCE | Source: Ambulatory Visit | Attending: Student | Admitting: Student

## 2018-08-29 ENCOUNTER — Other Ambulatory Visit: Payer: Self-pay | Admitting: Surgery

## 2018-08-29 DIAGNOSIS — K5720 Diverticulitis of large intestine with perforation and abscess without bleeding: Secondary | ICD-10-CM

## 2018-08-29 HISTORY — PX: IR RADIOLOGIST EVAL & MGMT: IMG5224

## 2018-08-29 MED ORDER — IOPAMIDOL (ISOVUE-300) INJECTION 61%
125.0000 mL | Freq: Once | INTRAVENOUS | Status: AC | PRN
  Administered 2018-08-29: 08:00:00 125 mL via INTRAVENOUS

## 2018-08-29 NOTE — Progress Notes (Signed)
Referring Physician(s): Louk,Alexandra M  Chief Complaint: The patient is seen in follow up today s/p diverticular abscess  History of present illness: Kristie Allen is a 63 year old female with history of DM, HTN, HLD who presented to [facility] ED with diverticulitis 08/05/18.  She did not improve with conservative management and developed worsening pain, fever, and chills. Repeat imaging 08/10/18 showed an enlarging abscess in the LLQ.  She underwent CT-guided drain placement 08/12/18.  She ultimately improved clinically and was discharged home with drain in place.  She presents to IR drain clinic today for CT Abdomen/Pelvis and drain injection.   Patient states she has been doing well at home.  She does endorse some abdominal pressure, but no fever, chills, nausea, vomiting, abdominal pain.  She has been flushing her drain with 10 mL sterile saline daily.  She brings a drain log today which shows 5-10 mL output daily. She reports a change in output over the past few days.  Describes output as "reddish, but now more brown."  Past Medical History:  Diagnosis Date  . Diabetes mellitus without complication ([place])   . Diverticulitis 07/2018  . Hypertension   . Hypothyroidism     Past Surgical History:  Procedure Laterality Date  . IR RADIOLOGIST EVAL & MGMT  08/29/2018  . VARICOSE VEIN SURGERY      Allergies: Bisphosphonates  Medications: Prior to Admission medications   Medication Sig Start Date End Date Taking? Authorizing Provider  aspirin EC 81 MG tablet Take 81 mg by mouth daily.    [provider]  atorvastatin (LIPITOR) 80 MG tablet Take 80 mg by mouth daily. 07/30/18   [provider]  cephALEXin (KEFLEX) 500 MG capsule Take 1 capsule (500 mg total) by mouth 4 (four) times daily for 14 days. 08/15/18 08/29/18  Marcell Anger, MD  cholecalciferol (VITAMIN D3) 25 MCG (1000 UT) tablet Take 1,000 Units by mouth daily.    [provider]   Empagliflozin-metFORMIN HCl ER (SYNJARDY XR) 12.08-998 MG TB24 Take 1 tablet by mouth daily.    [provider]  ezetimibe (ZETIA) 10 MG tablet Take 10 mg by mouth daily. 06/13/18   [provider]  levothyroxine (SYNTHROID, LEVOTHROID) 125 MCG tablet Take 125 mcg by mouth daily before breakfast. 04/23/18   [provider]  lisinopril-hydrochlorothiazide (PRINZIDE,ZESTORETIC) 20-12.5 MG tablet Take 1 tablet by mouth daily. 07/30/18   [provider]  magnesium 30 MG tablet Take 30 mg by mouth daily.    [provider]  meloxicam (MOBIC) 7.5 MG tablet Take 7.5 mg by mouth daily as needed for pain.  05/14/18   [provider]  metroNIDAZOLE (FLAGYL) 500 MG tablet Take 1 tablet (500 mg total) by mouth 3 (three) times daily for 14 days. 08/15/18 08/29/18  Spongberg, Audie Pinto, MD  metroNIDAZOLE (METROCREAM) 0.75 % cream Apply 1 application topically 2 (two) times daily. 07/16/18   [provider]  pantoprazole (PROTONIX) 40 MG tablet Take 40 mg by mouth daily. 04/24/18   [provider]  senna (SENOKOT) 8.6 MG TABS tablet Take 1 tablet (8.6 mg total) by mouth daily as needed for mild constipation. 08/03/18   Domenic Polite, MD  sodium chloride 0.9 % injection Inject 10 mLs into the vein daily. Inject 10 cc into drain once daily. 08/15/18   Louk, Bea Graff, PA-C  traMADol (ULTRAM) 50 MG tablet Take 1 tablet (50 mg total) by mouth every 6 (six) hours as needed for moderate pain. 08/15/18  Marcell Anger, MD     Family History  Problem Relation Age of Onset  . Anuerysm Mother   . Diabetes Mother   . Hypertension Mother   . Aneurysm Sister   . Diabetes Sister   . Aneurysm Brother   . Diabetes Brother   . Hypertension Brother   . Diabetes Father   . Heart attack Father   . Hypertension Father     Social History   Socioeconomic History  . Marital status: Widowed    Spouse name: Not on file  . Number of children: Not  on file  . Years of education: Not on file  . Highest education level: Not on file  Occupational History  . Not on file  Social Needs  . Financial resource strain: Not on file  . Food insecurity:    Worry: Not on file    Inability: Not on file  . Transportation needs:    Medical: Not on file    Non-medical: Not on file  Tobacco Use  . Smoking status: Former Research scientist (life sciences)  . Smokeless tobacco: Never Used  . Tobacco comment: " back in the 80"s"  Substance and Sexual Activity  . Alcohol use: Never    Frequency: Never  . Drug use: Never  . Sexual activity: Not on file  Lifestyle  . Physical activity:    Days per week: Not on file    Minutes per session: Not on file  . Stress: Not on file  Relationships  . Social connections:    Talks on phone: Not on file    Gets together: Not on file    Attends religious service: Not on file    Active member of club or organization: Not on file    Attends meetings of clubs or organizations: Not on file    Relationship status: Not on file  Other Topics Concern  . Not on file  Social History Narrative  . Not on file     Vital Signs: BP 124/70   Pulse 74   SpO2 99%   Physical Exam  NAD, alert Abdomen:  Soft, non-distended.  LLQ drain in place. Insertion site c/d/i.  Output is feculent-appearing.   Imaging: Ir Radiologist Eval & Mgmt  Result Date: 08/29/2018 Please refer to notes tab for details about interventional procedure. (Op Note)   Labs:  CBC: Recent Labs    08/12/18 0317 08/13/18 0919 08/14/18 0439 08/15/18 0411  WBC 14.5* 13.3* 12.3* 10.9*  HGB 11.9* 13.0 12.8 12.2  HCT 36.8 40.2 39.8 38.1  PLT 369 401* 390 399    COAGS: Recent Labs    08/11/18 0831  INR 1.1    BMP: Recent Labs    08/10/18 0941 08/11/18 0250 08/14/18 0439 08/15/18 0411  NA 141 140 142 142  K 3.8 3.7 3.5 3.6  CL 108 107 107 108  CO2 23 24 26 25   GLUCOSE 122* 113* 105* 99  BUN 7* 6* <5* <5*  CALCIUM 8.4* 8.4* 8.3* 8.6*  CREATININE  0.46 0.50 0.52 0.46  GFRNONAA >60 >60 >60 >60  GFRAA >60 >60 >60 >60    LIVER FUNCTION TESTS: Recent Labs    08/01/18 2037  BILITOT 1.2  AST 19  ALT 33  ALKPHOS 79  PROT 8.1  ALBUMIN 4.3    Assessment: Diverticular abscess s/p drain placement 08/12/18 by Dr. Kathlene Cote.  Patient presents to IR clinic today for drain evaluation and management.  She has been doing well at home.  She  is still on antibiotics. Her only complaint is a change in output color over the past few days.  CT Abdomen/Pelvis reviewed by Dr. Earleen Newport who notes improvement in collection.  However, drain injection performed and is consistent with fistulous connection.  Drain to remain in place.  Patient is instructed to stop flushing and allow to drain to gravity.  She has scheduled follow-up with surgery next week.  She is encouraged to keep appointment. She is scheduled for a repeat injection in 4 weeks. Patient verbalizes understanding of care plan.   Signed: Docia Barrier, PA 08/29/2018, 8:45 AM   Please refer to Dr. Earleen Newport attestation of this note for management and plan.

## 2018-09-26 ENCOUNTER — Other Ambulatory Visit: Payer: Self-pay | Admitting: Surgery

## 2018-09-26 ENCOUNTER — Ambulatory Visit
Admission: RE | Admit: 2018-09-26 | Discharge: 2018-09-26 | Disposition: A | Discharge status: Home / Self Care | Payer: PRIVATE HEALTH INSURANCE | Source: Ambulatory Visit | Attending: Surgery | Admitting: Surgery

## 2018-09-26 ENCOUNTER — Encounter: Payer: Self-pay | Admitting: Radiology

## 2018-09-26 DIAGNOSIS — K5720 Diverticulitis of large intestine with perforation and abscess without bleeding: Secondary | ICD-10-CM

## 2018-09-26 HISTORY — PX: IR RADIOLOGIST EVAL & MGMT: IMG5224

## 2018-09-26 NOTE — Progress Notes (Addendum)
Referring Physician(s): White,Christopher M  Chief Complaint: The patient is seen in follow up today s/p drainage of a left lower quadrant abdominal/diverticular abscess on 08/12/2018  History of present illness: Kristie Allen is a 63 year old female with history of DM, HTN, HLD who presented to [facility] ED with diverticulitis 08/05/18.  She did not improve with conservative management and developed worsening pain, fever, and chills. Repeat imaging 08/10/18 showed an enlarging abscess in the LLQ.  She underwent CT-guided drain placement 08/12/18. She ultimately improved clinically and was discharged home with drain in place.  Follow-up drain injection on 08/29/2018 revealed a thin fistulous tract from abscess cavity to sigmoid colon.  She was instructed not to flush drain after that evaluation.  She presents again today for follow-up drain injection.  She currently denies fever, headache, chest pain, dyspnea, cough, worsening abdominal/back pain, nausea, vomiting or bleeding.  She has had minimal output of feculent appearing material from the drain.  It is currently attached to a JP bulb.  She is not on antibiotic therapy.  Previous cultures had grown E. coli.   Past Medical History:  Diagnosis Date  . Diabetes mellitus without complication ([place])   . Diverticulitis 07/2018  . Hypertension   . Hypothyroidism     Past Surgical History:  Procedure Laterality Date  . IR RADIOLOGIST EVAL & MGMT  08/29/2018  . VARICOSE VEIN SURGERY      Allergies: Bisphosphonates  Medications: Prior to Admission medications   Medication Sig Start Date End Date Taking? Authorizing Provider  aspirin EC 81 MG tablet Take 81 mg by mouth daily.    [provider]  atorvastatin (LIPITOR) 80 MG tablet Take 80 mg by mouth daily. 07/30/18   [provider]  cholecalciferol (VITAMIN D3) 25 MCG (1000 UT) tablet Take 1,000 Units by mouth daily.    [provider]  Empagliflozin-metFORMIN HCl ER  (SYNJARDY XR) 12.08-998 MG TB24 Take 1 tablet by mouth daily.    [provider]  ezetimibe (ZETIA) 10 MG tablet Take 10 mg by mouth daily. 06/13/18   [provider]  levothyroxine (SYNTHROID, LEVOTHROID) 125 MCG tablet Take 125 mcg by mouth daily before breakfast. 04/23/18   [provider]  lisinopril-hydrochlorothiazide (PRINZIDE,ZESTORETIC) 20-12.5 MG tablet Take 1 tablet by mouth daily. 07/30/18   [provider]  magnesium 30 MG tablet Take 30 mg by mouth daily.    [provider]  meloxicam (MOBIC) 7.5 MG tablet Take 7.5 mg by mouth daily as needed for pain.  05/14/18   [provider]  metroNIDAZOLE (METROCREAM) 0.75 % cream Apply 1 application topically 2 (two) times daily. 07/16/18   [provider]  pantoprazole (PROTONIX) 40 MG tablet Take 40 mg by mouth daily. 04/24/18   [provider]  senna (SENOKOT) 8.6 MG TABS tablet Take 1 tablet (8.6 mg total) by mouth daily as needed for mild constipation. 08/03/18   Domenic Polite, MD  sodium chloride 0.9 % injection Inject 10 mLs into the vein daily. Inject 10 cc into drain once daily. 08/15/18   Louk, Bea Graff, PA-C  traMADol (ULTRAM) 50 MG tablet Take 1 tablet (50 mg total) by mouth every 6 (six) hours as needed for moderate pain. 08/15/18   Marcell Anger, MD     Family History  Problem Relation Age of Onset  . Anuerysm Mother   . Diabetes Mother   . Hypertension Mother   . Aneurysm Sister   . Diabetes Sister   .  Aneurysm Brother   . Diabetes Brother   . Hypertension Brother   . Diabetes Father   . Heart attack Father   . Hypertension Father     Social History   Socioeconomic History  . Marital status: Widowed    Spouse name: Not on file  . Number of children: Not on file  . Years of education: Not on file  . Highest education level: Not on file  Occupational History  . Not on file  Social Needs  . Financial resource strain: Not on file  .  Food insecurity:    Worry: Not on file    Inability: Not on file  . Transportation needs:    Medical: Not on file    Non-medical: Not on file  Tobacco Use  . Smoking status: Former Research scientist (life sciences)  . Smokeless tobacco: Never Used  . Tobacco comment: " back in the 80"s"  Substance and Sexual Activity  . Alcohol use: Never    Frequency: Never  . Drug use: Never  . Sexual activity: Not on file  Lifestyle  . Physical activity:    Days per week: Not on file    Minutes per session: Not on file  . Stress: Not on file  Relationships  . Social connections:    Talks on phone: Not on file    Gets together: Not on file    Attends religious service: Not on file    Active member of club or organization: Not on file    Attends meetings of clubs or organizations: Not on file    Relationship status: Not on file  Other Topics Concern  . Not on file  Social History Narrative  . Not on file     Vital Signs: BP 139/69   Pulse 85   SpO2 97%   Physical Exam awake, alert.  abd soft; left lower quadrant drain intact.  Small amount of surrounding erythema noted at drain site from apparent reaction to tape/bandage;minimal amount of feculent appearing fluid in JP bulb.  Imaging: No results found.  Labs:  CBC: Recent Labs    08/12/18 0317 08/13/18 0919 08/14/18 0439 08/15/18 0411  WBC 14.5* 13.3* 12.3* 10.9*  HGB 11.9* 13.0 12.8 12.2  HCT 36.8 40.2 39.8 38.1  PLT 369 401* 390 399    COAGS: Recent Labs    08/11/18 0831  INR 1.1    BMP: Recent Labs    08/10/18 0941 08/11/18 0250 08/14/18 0439 08/15/18 0411  NA 141 140 142 142  K 3.8 3.7 3.5 3.6  CL 108 107 107 108  CO2 23 24 26 25   GLUCOSE 122* 113* 105* 99  BUN 7* 6* <5* <5*  CALCIUM 8.4* 8.4* 8.3* 8.6*  CREATININE 0.46 0.50 0.52 0.46  GFRNONAA >60 >60 >60 >60  GFRAA >60 >60 >60 >60    LIVER FUNCTION TESTS: Recent Labs    08/01/18 2037  BILITOT 1.2  AST 19  ALT 33  ALKPHOS 79  PROT 8.1  ALBUMIN 4.3     Assessment: Patient status post drainage of a left lower quadrant diverticular abscess on 08/12/2018 with subsequent drain injection on 08/29/2018 revealing a thin fistulous tract from the abscess cavity to the sigmoid colon.  Patient currently stable and denies fever, chills, worsening abdominal pain, nausea, vomiting or bleeding.  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  Imaging studies were reviewed by Dr. Vernard Gambles.  He recommends maintaining current drain and flushing drain  once daily with 3 to 5 cc of sterile normal saline.  The drain was connected to gravity bag.  New StatLock device was applied to the drain.  She will be scheduled for follow-up drain injection in 4 weeks.  She is to continue to follow-up with her surgeon Dr. Nadeen Landau as scheduled.  She states that a colonoscopy is planned in the future.  She was told to contact our service with any additional drain related questions or concerns.  She was instructed on how to flush drain. A prescription for saline flushes was  given to patient.  Signed: D. Rowe Robert, PA-C 09/26/2018, 1:31 PM   Please refer to Dr. Adron Bene attestation of this note for management and plan.      Patient ID: Kristie Allen, female   DOB: June 29, 1955, 63 y.o.   MRN: [id number]

## 2018-10-01 ENCOUNTER — Encounter: Payer: Self-pay | Admitting: Physician Assistant

## 2018-10-12 ENCOUNTER — Encounter: Payer: Self-pay | Admitting: *Deleted

## 2018-10-15 ENCOUNTER — Ambulatory Visit (INDEPENDENT_AMBULATORY_CARE_PROVIDER_SITE_OTHER): Payer: PRIVATE HEALTH INSURANCE | Admitting: Physician Assistant

## 2018-10-15 ENCOUNTER — Encounter: Payer: Self-pay | Admitting: Physician Assistant

## 2018-10-15 VITALS — Ht 64.0 in | Wt 250.0 lb

## 2018-10-15 DIAGNOSIS — Z8719 Personal history of other diseases of the digestive system: Secondary | ICD-10-CM

## 2018-10-15 DIAGNOSIS — Z1211 Encounter for screening for malignant neoplasm of colon: Secondary | ICD-10-CM | POA: Diagnosis not present

## 2018-10-15 MED ORDER — SUPREP BOWEL PREP KIT 17.5-3.13-1.6 GM/177ML PO SOLN: ORAL | 0 refills | Status: DC

## 2018-10-15 NOTE — Patient Instructions (Addendum)
If you are age 62 or older, your body mass index should be between 23-30. Your Body mass index is 42.91 kg/m. If this is out of the aforementioned range listed, please consider follow up with your Primary Care Provider.  If you are age 45 or younger, your body mass index should be between 19-25. Your Body mass index is 42.91 kg/m. If this is out of the aformentioned range listed, please consider follow up with your Primary Care Provider.   To help prevent the possible spread of infection to our patients, communities, and staff; we will be implementing the following measures:  As of now we are not allowing any visitors/family members to accompany you to any upcoming appointments with [facility]. If you have any concerns about this please contact our office to discuss prior to the appointment.   You have been scheduled for a colonoscopy. Please follow written instructions sent to your MyChart account and mailed to you today.   Please pick up your prep supplies at the pharmacy within the next 1-3 days. If you use inhalers (even only as needed), please bring them with you on the day of your procedure.  Your physician has requested that you go to www.startemmi.com and enter the access code given to you at your visit today. This web site gives a general overview about your procedure. However, you should still follow specific instructions given to you by our office regarding your preparation for the procedure.  Please take Miralax daily as directed.  Thank you for entrusting me with your care and for choosing [place],  [place], [place]

## 2018-10-15 NOTE — Progress Notes (Signed)
Subjective:    Patient ID: Kristie Allen, female    DOB: 05/31/1955, 63 y.o.   MRN: [id number] This service was provided via telemedicine.  Telephone/ call. The patient was located at home. The provider was located in provider's GI office. The patient did consent to this telephone visit and is aware of possible charges with her insurance for this visit. To persons participating in this telemedicine service were the patient and I. Time spent on call;16 min HPI Kristie Allen is a pleasant 63 year old female, new to GI today referred by Dr. Harrell Gave [facility] [place] surgery for possible colonoscopy. Patient reports one prior colonoscopy done 10 or 11 years ago in [place].  She does not recall the provider's name but remembers being told it was a negative exam and to follow-up in 10 years.  Patient's hospital records and note from [place] have been reviewed. Patient was hospitalized in April 2020 with acute diverticulitis which was her initial episode.  She was initially on the medicine service, and discharged on Augmentin.  She returned to the hospital 2 days later with worsening left lower quadrant pain and fever and CT scan showed a perisigmoid abscess posterior to the sigmoid colon.  She was started on IV antibiotics and underwent percutaneous drainage by IR on 08/12/2018.  By her records her symptoms improved thereafter and she was able to be discharged on 08/15/2018.  She was discharged with a percutaneous drain. She returned for drain study on 08/29/2018 which did show a small thin fistulous communication from the catheter site to the sigmoid colon.  Patient says that the drain has been producing very little output.  She does flush it with 4 cc of saline daily and has only been seeing perhaps a couple of cc of drainage per day. 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 She is scheduled for  repeat drain study on 10/24/2018. Decision is pending regarding need for colon resection and colonoscopy is requested in the interim. Patient states she is back at work, she is not having any abdominal pain No fever or chills.  Her bowels have been moving regularly with use of MiraLAX on a daily basis.  She has not noted any melena or hematochezia. She has not had any antibiotics in over a month. Other medical problems include obesity with BMI of 42, hypertension, hyperlipidemia, adult onset diabetes mellitus, and hypothyroidism.  She also has sleep apnea, no oxygen use.  Review of Systems Pertinent positive and negative review of systems were noted in the above HPI section.  All other review of systems was otherwise negative.  Outpatient Encounter Medications as of 10/15/2018  Medication Sig  . aspirin EC 81 MG tablet Take 81 mg by mouth daily.  Marland Kitchen atorvastatin (LIPITOR) 80 MG tablet Take 80 mg by mouth daily.  . cholecalciferol (VITAMIN D3) 25 MCG (1000 UT) tablet Take 1,000 Units by mouth daily.  . Empagliflozin-metFORMIN HCl ER (SYNJARDY XR) 12.08-998 MG TB24 Take 1 tablet by mouth daily.  Marland Kitchen ezetimibe (ZETIA) 10 MG tablet Take 10 mg by mouth daily.  Marland Kitchen levothyroxine (SYNTHROID, LEVOTHROID) 125 MCG tablet Take 125 mcg by mouth daily before breakfast.  . lisinopril-hydrochlorothiazide (PRINZIDE,ZESTORETIC) 20-12.5 MG tablet Take 1 tablet by mouth daily.  . magnesium 30 MG tablet Take 30 mg by mouth daily.  . meloxicam (MOBIC) 7.5 MG tablet Take 7.5 mg by mouth daily as needed for pain.   Marland Kitchen  metroNIDAZOLE (METROCREAM) 0.75 % cream Apply 1 application topically 2 (two) times daily.  . pantoprazole (PROTONIX) 40 MG tablet Take 40 mg by mouth daily.  Marland Kitchen senna (SENOKOT) 8.6 MG TABS tablet Take 1 tablet (8.6 mg total) by mouth daily as needed for mild constipation.  . sodium chloride 0.9 % injection Inject 10 mLs into the vein daily. Inject 10 cc into drain once daily.  . traMADol (ULTRAM) 50 MG tablet  Take 1 tablet (50 mg total) by mouth every 6 (six) hours as needed for moderate pain.  Marland Kitchen SUPREP BOWEL PREP KIT 17.5-3.13-1.6 GM/177ML SOLN Suprep-Use as directed   No facility-administered encounter medications on file as of 10/15/2018.    Allergies  Allergen Reactions  . Bisphosphonates Other (See Comments)    Flu symptoms   Patient Active Problem List   Diagnosis Date Noted  . History of diverticulitis 10/15/2018  . Diverticulitis of large intestine with perforation 08/05/2018  . DM2 (diabetes mellitus, type 2) ([place]) 08/02/2018  . HLD (hyperlipidemia) 08/02/2018  . HTN (hypertension) 08/02/2018  . Diverticulitis 08/02/2018  . Acute diverticulitis 08/01/2018   Social History   Socioeconomic History  . Marital status: Widowed    Spouse name: Not on file  . Number of children: Not on file  . Years of education: Not on file  . Highest education level: Not on file  Occupational History  . Not on file  Social Needs  . Financial resource strain: Not on file  . Food insecurity    Worry: Not on file    Inability: Not on file  . Transportation needs    Medical: Not on file    Non-medical: Not on file  Tobacco Use  . Smoking status: Former Research scientist (life sciences)  . Smokeless tobacco: Never Used  . Tobacco comment: " back in the 80"s"  Substance and Sexual Activity  . Alcohol use: Never    Frequency: Never  . Drug use: Never  . Sexual activity: Not on file  Lifestyle  . Physical activity    Days per week: Not on file    Minutes per session: Not on file  . Stress: Not on file  Relationships  . Social Herbalist on phone: Not on file    Gets together: Not on file    Attends religious service: Not on file    Active member of club or organization: Not on file    Attends meetings of clubs or organizations: Not on file    Relationship status: Not on file  . Intimate partner violence    Fear of current or ex partner: Not on file    Emotionally abused: Not on file    Physically  abused: Not on file    Forced sexual activity: Not on file  Other Topics Concern  . Not on file  Social History Narrative  . Not on file    Ms. Burck's family history includes Aneurysm in her brother and sister; Anuerysm in her mother; Diabetes in her brother, father, mother, and sister; Heart attack in her father; Hypertension in her brother, father, and mother.      Objective:    There were no vitals filed for this visit.  Not examined today, telehealth visit       Assessment & Plan:   #99 63 year old female with history of acute diverticulitis, complicated by very sigmoid abscess, requiring hospitalization April 2020 with IR drainage. 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  Last drain study 09/26/2018 showed complete resolution of the abscess.  Patient is clinically doing well not currently having any abdominal pain.  Drain remains in place with very minimal output and plan is for follow-up drain study on 10/24/2018.  #2 colon cancer surveillance-last colonoscopy greater than 10 years ago/[place] #3 obesity #4.  Hypertension #5.  Adult onset diabetes mellitus #6.  Sleep apnea #7.  Hypothyroid  Plan; Patient will be scheduled for colonoscopy with Dr. Fuller Plan.  Procedure was discussed in detail with the patient including indications risks and benefits and she is agreeable to proceed.  Patient will also proceed with previously scheduled drain injection study on 10/24/2018.  She is aware that if she develops any recurrent abdominal pain she will need reevaluation and may affect timing of Colonoscopy.   S  PA-C 10/15/2018   Cc: [name]., MD

## 2018-10-16 NOTE — Progress Notes (Signed)
Reviewed and agree with management plan.  Akai Dollard T. Cheyrl Buley, MD FACG 

## 2018-10-17 HISTORY — PX: COLONOSCOPY: SHX174

## 2018-10-24 ENCOUNTER — Ambulatory Visit
Admission: RE | Admit: 2018-10-24 | Discharge: 2018-10-24 | Disposition: A | Discharge status: Home / Self Care | Payer: PRIVATE HEALTH INSURANCE | Source: Ambulatory Visit | Attending: Surgery | Admitting: Surgery

## 2018-10-24 ENCOUNTER — Encounter: Payer: Self-pay | Admitting: Radiology

## 2018-10-24 ENCOUNTER — Other Ambulatory Visit: Payer: Self-pay | Admitting: Surgery

## 2018-10-24 DIAGNOSIS — K5720 Diverticulitis of large intestine with perforation and abscess without bleeding: Secondary | ICD-10-CM

## 2018-10-24 HISTORY — PX: IR RADIOLOGIST EVAL & MGMT: IMG5224

## 2018-10-24 NOTE — Progress Notes (Addendum)
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 Diabetes mellitus without complication ([place])   . Diverticulitis 07/2018  . Hypertension   . Hypothyroidism     Past Surgical History:  Procedure Laterality Date  . IR RADIOLOGIST EVAL & MGMT  08/29/2018  . IR RADIOLOGIST EVAL & MGMT  09/26/2018  . IR RADIOLOGIST EVAL & MGMT  10/24/2018  . VARICOSE VEIN SURGERY      Allergies: Bisphosphonates  Medications: Prior to Admission medications   Medication Sig Start Date End Date Taking? Authorizing Provider  aspirin EC 81 MG tablet Take 81 mg by mouth daily.    [provider]  atorvastatin (LIPITOR) 80 MG tablet Take 80 mg by mouth daily. 07/30/18   [provider]  cholecalciferol (VITAMIN D3) 25 MCG (1000 UT) tablet Take 1,000 Units by mouth daily.    [provider]  Empagliflozin-metFORMIN HCl ER (SYNJARDY XR) 12.08-998 MG TB24 Take 1 tablet by mouth daily.    [provider]  ezetimibe (ZETIA) 10 MG tablet Take 10 mg by mouth daily. 06/13/18   [provider]  levothyroxine (SYNTHROID, LEVOTHROID) 125 MCG tablet Take 125 mcg by mouth daily before breakfast. 04/23/18   [provider]  lisinopril-hydrochlorothiazide (PRINZIDE,ZESTORETIC) 20-12.5 MG tablet Take 1 tablet by mouth daily. 07/30/18   [provider]  magnesium 30 MG tablet Take 30 mg by mouth daily.    [provider]  meloxicam (MOBIC) 7.5 MG tablet Take 7.5 mg by mouth daily as needed for pain.  05/14/18   [provider]   metroNIDAZOLE (METROCREAM) 0.75 % cream Apply 1 application topically 2 (two) times daily. 07/16/18   [provider]  pantoprazole (PROTONIX) 40 MG tablet Take 40 mg by mouth daily. 04/24/18   [provider]  senna (SENOKOT) 8.6 MG TABS tablet Take 1 tablet (8.6 mg total) by mouth daily as needed for mild constipation. 08/03/18   Domenic Polite, MD  sodium chloride 0.9 % injection Inject 10 mLs into the vein daily. Inject 10 cc into drain once daily. 08/15/18   Louk, Bea Graff, PA-C  SUPREP BOWEL PREP KIT 17.5-3.13-1.6 GM/177ML SOLN Suprep-Use as directed 10/15/18   Esterwood, Amy S, PA-C  traMADol (ULTRAM) 50 MG tablet Take 1 tablet (50 mg total) by mouth every 6 (six) hours as needed for moderate pain. 08/15/18   Marcell Anger, MD     Family History  Problem Relation Age of Onset  . Anuerysm Mother   . Diabetes Mother   . Hypertension Mother   . Aneurysm Sister   . Diabetes Sister   . Aneurysm Brother   . Diabetes Brother   . Hypertension Brother   . Diabetes Father   . Heart attack Father   . Hypertension Father     Social History   Socioeconomic History  . Marital status: Widowed    Spouse name: Not on file  . Number of children: Not on file  . Years of education: Not on file  . Highest education level: Not on file  Occupational History  .  Not on file  Social Needs  . Financial resource strain: Not on file  . Food insecurity    Worry: Not on file    Inability: Not on file  . Transportation needs    Medical: Not on file    Non-medical: Not on file  Tobacco Use  . Smoking status: Former Research scientist (life sciences)  . Smokeless tobacco: Never Used  . Tobacco comment: " back in the 80"s"  Substance and Sexual Activity  . Alcohol use: Never    Frequency: Never  . Drug use: Never  . Sexual activity: Not on file  Lifestyle  . Physical activity    Days per week: Not on file    Minutes per session: Not on file  . Stress: Not on file  Relationships  . Social  Herbalist on phone: Not on file    Gets together: Not on file    Attends religious service: Not on file    Active member of club or organization: Not on file    Attends meetings of clubs or organizations: Not on file    Relationship status: Not on file  Other Topics Concern  . Not on file  Social History Narrative  . Not on file     Vital Signs: BP (!) 156/78   Pulse 77   SpO2 97%   Afeb  Physical Exam Skin:    General: Skin is warm and dry.     Comments: Site is clean and dry NT no bleeding  Suture is unattached-- but drain is in good position  Drain injection reveals persistent small fistula per Dr Anselm Pancoast  New stat lock laced New dressing New drain bag      Imaging: Ir Radiologist Eval & Mgmt  Result Date: 10/24/2018 Please refer to notes tab for details about interventional procedure. (Op Note)   Labs:  CBC: Recent Labs    08/12/18 0317 08/13/18 0919 08/14/18 0439 08/15/18 0411  WBC 14.5* 13.3* 12.3* 10.9*  HGB 11.9* 13.0 12.8 12.2  HCT 36.8 40.2 39.8 38.1  PLT 369 401* 390 399    COAGS: Recent Labs    08/11/18 0831  INR 1.1    BMP: Recent Labs    08/10/18 0941 08/11/18 0250 08/14/18 0439 08/15/18 0411  NA 141 140 142 142  K 3.8 3.7 3.5 3.6  CL 108 107 107 108  CO2 '23 24 26 25  ' GLUCOSE 122* 113* 105* 99  BUN 7* 6* <5* <5*  CALCIUM 8.4* 8.4* 8.3* 8.6*  CREATININE 0.46 0.50 0.52 0.46  GFRNONAA >60 >60 >60 >60  GFRAA >60 >60 >60 >60    LIVER FUNCTION TESTS: Recent Labs    08/01/18 2037  BILITOT 1.2  AST 19  ALT 33  ALKPHOS 79  PROT 8.1  ALBUMIN 4.3    Assessment:  LLQ diverticular abscess Drain placed 08/12/18 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 Anselm Pancoast attestation of this note for management and plan.

## 2018-11-02 ENCOUNTER — Telehealth: Payer: Self-pay | Admitting: Gastroenterology

## 2018-11-02 NOTE — Telephone Encounter (Signed)

## 2018-11-05 ENCOUNTER — Other Ambulatory Visit: Payer: Self-pay

## 2018-11-05 ENCOUNTER — Ambulatory Visit (AMBULATORY_SURGERY_CENTER): Payer: PRIVATE HEALTH INSURANCE | Admitting: Gastroenterology

## 2018-11-05 ENCOUNTER — Encounter: Payer: Self-pay | Admitting: Gastroenterology

## 2018-11-05 VITALS — BP 127/68 | HR 62 | Temp 97.8°F | Resp 13 | Ht 64.0 in | Wt 250.0 lb

## 2018-11-05 DIAGNOSIS — D128 Benign neoplasm of rectum: Secondary | ICD-10-CM

## 2018-11-05 DIAGNOSIS — R933 Abnormal findings on diagnostic imaging of other parts of digestive tract: Secondary | ICD-10-CM

## 2018-11-05 DIAGNOSIS — D122 Benign neoplasm of ascending colon: Secondary | ICD-10-CM | POA: Diagnosis not present

## 2018-11-05 DIAGNOSIS — D123 Benign neoplasm of transverse colon: Secondary | ICD-10-CM

## 2018-11-05 DIAGNOSIS — K648 Other hemorrhoids: Secondary | ICD-10-CM | POA: Diagnosis not present

## 2018-11-05 DIAGNOSIS — D125 Benign neoplasm of sigmoid colon: Secondary | ICD-10-CM

## 2018-11-05 DIAGNOSIS — K5730 Diverticulosis of large intestine without perforation or abscess without bleeding: Secondary | ICD-10-CM | POA: Diagnosis present

## 2018-11-05 DIAGNOSIS — Z8719 Personal history of other diseases of the digestive system: Secondary | ICD-10-CM

## 2018-11-05 MED ORDER — SODIUM CHLORIDE 0.9 % IV SOLN: 500.0000 mL | Freq: Once | INTRAVENOUS | Status: DC

## 2018-11-05 NOTE — Progress Notes (Signed)
PT taken to PACU. Monitors in place. VSS. Report given to RN. 

## 2018-11-05 NOTE — Progress Notes (Signed)
Fayrene Fearing took temp and Rica Mote took vitals.

## 2018-11-05 NOTE — Op Note (Addendum)
[place] Patient Name: Kristie Allen Procedure Date: 11/05/2018 4:06 PM MRN: [id number] Endoscopist: Ladene Artist , MD Age: 63 Referring MD:  Date of Birth: 06-26-1955 Gender: Female Account #: [id number] Procedure:                Colonoscopy Indications:              Abnormal CT of the GI tract (colon), recent                            diverticulitis with abscess Medicines:                Monitored Anesthesia Care Procedure:                Pre-Anesthesia Assessment:                           - Prior to the procedure, a History and Physical                            was performed, and patient medications and                            allergies were reviewed. The patient's tolerance of                            previous anesthesia was also reviewed. The risks                            and benefits of the procedure and the sedation                            options and risks were discussed with the patient.                            All questions were answered, and informed consent                            was obtained. Prior Anticoagulants: The patient has                            taken no previous anticoagulant or antiplatelet                            agents. ASA Grade Assessment: III - A patient with                            severe systemic disease. After reviewing the risks                            and benefits, the patient was deemed in                            satisfactory condition to undergo the procedure.  After obtaining informed consent, the colonoscope                            was passed under direct vision. Throughout the                            procedure, the patient's blood pressure, pulse, and                            oxygen saturations were monitored continuously. The                            Colonoscope was introduced through the anus and                            advanced to the the cecum, identified  by                            appendiceal orifice and ileocecal valve. The                            ileocecal valve, appendiceal orifice, and rectum                            were photographed. The quality of the bowel                            preparation was good. The colonoscopy was performed                            without difficulty. The patient tolerated the                            procedure well. Scope In: 4:20:20 PM Scope Out: 4:39:15 PM Scope Withdrawal Time: 0 hours 13 minutes 56 seconds  Total Procedure Duration: 0 hours 18 minutes 55 seconds  Findings:                 The perianal and digital rectal examinations were                            normal.                           Four sessile polyps were found in the rectum (3)                            and sigmoid colon (1). The polyps were 6 to 7 mm in                            size. These polyps were removed with a cold snare.                            Resection and retrieval were complete.  Two sessile polyps were found in the transverse                            colon and ascending colon. The polyps were 3 to 4                            mm in size. These polyps were removed with a cold                            biopsy forceps. Resection and retrieval were                            complete.                           Multiple small-mouthed diverticula were found in                            the left colon. There was narrowing of the colon in                            association with the diverticular opening. There                            was evidence of diverticular spasm.                            Peri-diverticular erythema was seen. There was no                            evidence of diverticular bleeding.                           Internal hemorrhoids were found during                            retroflexion. The hemorrhoids were small and Grade                             I (internal hemorrhoids that do not prolapse).                           The exam was otherwise without abnormality on                            direct and retroflexion views. Complications:            No immediate complications. Estimated blood loss:                            None. Estimated Blood Loss:     Estimated blood loss: none. Impression:               - Four 6 to 7 mm polyps in the rectum and in the  sigmoid colon, removed with a cold snare. Resected                            and retrieved.                           - Two 3 to 4 mm polyps in the transverse colon and                            in the ascending colon, removed with a cold biopsy                            forceps. Resected and retrieved.                           - Moderate diverticulosis in the left colon.                           - The examination was otherwise normal on direct                            and retroflexion views. Recommendation:           - Repeat colonoscopy after pathology studies are                            complete for surveillance.                           - Patient has a contact number available for                            emergencies. The signs and symptoms of potential                            delayed complications were discussed with the                            patient. Return to normal activities tomorrow.                            Written discharge instructions were provided to the                            patient.                           - High fiber diet.                           - Continue present medications.                           - Await pathology results.                           -  Return appt with Dr. Dema Severin. Ladene Artist, MD 11/05/2018 4:51:15 PM This report has been signed electronically.

## 2018-11-05 NOTE — Progress Notes (Signed)
Pt's states no medical or surgical changes since previsit or office visit. 

## 2018-11-05 NOTE — Patient Instructions (Addendum)
YOU HAD AN ENDOSCOPIC PROCEDURE TODAY AT [facility]:   Refer to the procedure report that was given to you for any specific questions about what was found during the examination.  If the procedure report does not answer your questions, please call your gastroenterologist to clarify.  If you requested that your care partner not be given the details of your procedure findings, then the procedure report has been included in a sealed envelope for you to review at your convenience later.  YOU SHOULD EXPECT: Some feelings of bloating in the abdomen. Passage of more gas than usual.  Walking can help get rid of the air that was put into your GI tract during the procedure and reduce the bloating. If you had a lower endoscopy (such as a colonoscopy or flexible sigmoidoscopy) you may notice spotting of blood in your stool or on the toilet paper. If you underwent a bowel prep for your procedure, you may not have a normal bowel movement for a few days.  Please Note:  You might notice some irritation and congestion in your nose or some drainage.  This is from the oxygen used during your procedure.  There is no need for concern and it should clear up in a day or so.  SYMPTOMS TO REPORT IMMEDIATELY:   Following lower endoscopy (colonoscopy or flexible sigmoidoscopy):  Excessive amounts of blood in the stool  Significant tenderness or worsening of abdominal pains  Swelling of the abdomen that is new, acute  Fever of 100F or higher  For urgent or emergent issues, a gastroenterologist can be reached at any hour by calling [phone].   DIET:  We do recommend a small meal at first, but then you may proceed to your regular diet.  Drink plenty of fluids but you should avoid alcoholic beverages for 24 hours.  ACTIVITY:  You should plan to take it easy for the rest of today and you should NOT DRIVE or use heavy machinery until tomorrow (because of the sedation medicines used during the test).     FOLLOW UP: Our staff will call the number listed on your records 48-72 hours following your procedure to check on you and address any questions or concerns that you may have regarding the information given to you following your procedure. If we do not reach you, we will leave a message.  We will attempt to reach you two times.  During this call, we will ask if you have developed any symptoms of COVID 19. If you develop any symptoms (ie: fever, flu-like symptoms, shortness of breath, cough etc.) before then, please call [phone].  If you test positive for Covid 19 in the 2 weeks post procedure, please call and report this information to [place].    If any biopsies were taken you will be contacted by phone or by letter within the next 1-3 weeks.  Please call us at [phone] if you have not heard about the biopsies in 3 weeks.    SIGNATURES/CONFIDENTIALITY: You and/or your care partner have signed paperwork which will be entered into your electronic medical record.  These signatures attest to the fact that that the information above on your After Visit Summary has been reviewed and is understood.  Full responsibility of the confidentiality of this discharge information lies with you and/or your care-partner.    Handouts were given to you on polyps, diverticulosis, hemorrhoids,  and a high fiber diet with liberal fluid intake. Your blood sugar was 80 in  the recovery room. You may resume your current medications today. Await biopsy results. Dr. Lynne Leader office will call you with an appointment with Dr. Dema Severin. Please call if any questions or concerns.

## 2018-11-05 NOTE — Progress Notes (Signed)
No problems noted in the recovery room. maw 

## 2018-11-05 NOTE — Progress Notes (Signed)
Called to room to assist during endoscopic procedure.  Patient ID and intended procedure confirmed with present staff. Received instructions for my participation in the procedure from the performing physician.  

## 2018-11-07 ENCOUNTER — Ambulatory Visit
Admission: RE | Admit: 2018-11-07 | Discharge: 2018-11-07 | Disposition: A | Discharge status: Home / Self Care | Payer: PRIVATE HEALTH INSURANCE | Source: Ambulatory Visit | Attending: Surgery | Admitting: Surgery

## 2018-11-07 ENCOUNTER — Telehealth: Payer: Self-pay

## 2018-11-07 DIAGNOSIS — K5720 Diverticulitis of large intestine with perforation and abscess without bleeding: Secondary | ICD-10-CM

## 2018-11-07 HISTORY — PX: IR RADIOLOGIST EVAL & MGMT: IMG5224

## 2018-11-07 NOTE — Telephone Encounter (Signed)
  Follow up Call-  Call back number 11/05/2018  Post procedure Call Back phone  # [phone]  Permission to leave phone message Yes     Patient questions:  Do you have a fever, pain , or abdominal swelling? No. Pain Score  0 *  Have you tolerated food without any problems? Yes.    Have you been able to return to your normal activities? Yes.    Do you have any questions about your discharge instructions: Diet   No. Medications  No. Follow up visit  No.  Do you have questions or concerns about your Care? No.  Actions: * If pain score is 4 or above: 1. No action needed, pain <4.Have you developed a fever since your procedure? no  2.   Have you had an respiratory symptoms (SOB or cough) since your procedure? no  3.   Have you tested positive for COVID 19 since your procedure no  4.   Have you had any family members/close contacts diagnosed with the COVID 19 since your procedure?  no   If yes to any of these questions please route to Joylene John, RN and Alphonsa Gin, Therapist, sports.

## 2018-11-07 NOTE — Progress Notes (Signed)
Patient ID: Kristie Allen, female   DOB: 1955-09-01, 63 y.o.   MRN: [id number]       Chief Complaint: Patient was seen in consultation today for diverticular abscess drain at the request of Lincoln M  Referring Physician(s): White,Christopher M  History of Present Illness: Kristie Allen is a 63 y.o. 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  She has been followed as an outpatient.  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  Overall she is doing well.  She has discontinued daily flushing.  Output is minimal.  No significant pain or fever.  She returns for repeat drain injection.  Past Medical History:  Diagnosis Date  . Diabetes mellitus without complication ([place])   . Diverticulitis 07/2018  . Hypertension   . Hypothyroidism     Past Surgical History:  Procedure Laterality Date  . IR RADIOLOGIST EVAL & MGMT  08/29/2018  . IR RADIOLOGIST EVAL & MGMT  09/26/2018  . IR RADIOLOGIST EVAL & MGMT  10/24/2018  . IR RADIOLOGIST EVAL & MGMT  11/07/2018  . VARICOSE VEIN SURGERY      Allergies: Bisphosphonates  Medications: Prior to Admission medications   Medication Sig Start Date End Date Taking? Authorizing Provider  aspirin EC 81 MG tablet Take 81 mg by mouth daily.    [provider]  atorvastatin (LIPITOR) 80 MG tablet Take 80 mg by mouth daily. 07/30/18   [provider]  cholecalciferol (VITAMIN D3) 25 MCG (1000 UT) tablet Take 1,000 Units by mouth daily.    [provider]  Empagliflozin-metFORMIN HCl ER (SYNJARDY XR) 12.08-998 MG TB24 Take 1 tablet by mouth daily.    [provider]  ezetimibe (ZETIA) 10 MG tablet Take 10 mg by mouth daily. 06/13/18   [provider]  levothyroxine (SYNTHROID, LEVOTHROID) 125 MCG tablet Take 125 mcg by mouth daily before breakfast. 04/23/18   [provider]  lisinopril-hydrochlorothiazide (PRINZIDE,ZESTORETIC) 20-12.5 MG tablet Take 1 tablet by mouth  daily. 07/30/18   [provider]  magnesium 30 MG tablet Take 30 mg by mouth daily.    [provider]  meloxicam (MOBIC) 7.5 MG tablet Take 7.5 mg by mouth daily as needed for pain.  05/14/18   [provider]  metroNIDAZOLE (METROCREAM) 0.75 % cream Apply 1 application topically 2 (two) times daily. 07/16/18   [provider]  pantoprazole (PROTONIX) 40 MG tablet Take 40 mg by mouth daily. 04/24/18   [provider]  senna (SENOKOT) 8.6 MG TABS tablet Take 1 tablet (8.6 mg total) by mouth daily as needed for mild constipation. 08/03/18   Domenic Polite, MD  sodium chloride 0.9 % injection Inject 10 mLs into the vein daily. Inject 10 cc into drain once daily. 08/15/18   Louk, Bea Graff, PA-C  traMADol (ULTRAM) 50 MG tablet Take 1 tablet (50 mg total) by mouth every 6 (six) hours as needed for moderate pain. 08/15/18   Marcell Anger, MD     Family History  Problem Relation Age of Onset  . Anuerysm Mother   . Diabetes Mother   . Hypertension Mother   . Aneurysm Sister   . Diabetes Sister   . Aneurysm Brother   . Diabetes Brother   . Hypertension Brother   . Diabetes Father   . Heart attack Father   . Hypertension Father   . Colon cancer Neg Hx   . Esophageal cancer Neg Hx   . Rectal cancer Neg  Hx   . Stomach cancer Neg Hx     Social History   Socioeconomic History  . Marital status: Widowed    Spouse name: Not on file  . Number of children: Not on file  . Years of education: Not on file  . Highest education level: Not on file  Occupational History  . Not on file  Social Needs  . Financial resource strain: Not on file  . Food insecurity    Worry: Not on file    Inability: Not on file  . Transportation needs    Medical: Not on file    Non-medical: Not on file  Tobacco Use  . Smoking status: Former Research scientist (life sciences)  . Smokeless tobacco: Never Used  . Tobacco comment: " back in the 80"s"  Substance and Sexual Activity  . Alcohol  use: Yes    Alcohol/week: 2.0 standard drinks    Types: 2 Shots of liquor per week    Frequency: Never    Comment: mixed drinks 2 times a week  . Drug use: Never  . Sexual activity: Not on file  Lifestyle  . Physical activity    Days per week: Not on file    Minutes per session: Not on file  . Stress: Not on file  Relationships  . Social Herbalist on phone: Not on file    Gets together: Not on file    Attends religious service: Not on file    Active member of club or organization: Not on file    Attends meetings of clubs or organizations: Not on file    Relationship status: Not on file  Other Topics Concern  . Not on file  Social History Narrative  . Not on file      Review of Systems: A 12 point ROS discussed and pertinent positives are indicated in the HPI above.  All other systems are negative.  Review of Systems  Vital Signs: BP (!) 149/80   Pulse 69   Temp (!) 97.4 F (36.3 C)   SpO2 98%   Physical Exam Constitutional:      General: She is not in acute distress.    Appearance: She is not toxic-appearing.  Eyes:     General: No scleral icterus.    Conjunctiva/sclera: Conjunctivae normal.  Abdominal:     General: There is no distension.     Palpations: Abdomen is soft.     Tenderness: There is no abdominal tenderness.     Comments: Left lower quadrant catheter site clean, dry and intact.  Neurological:     Mental Status: She is alert.      Imaging: Dg Sinus/fist Tube Chk-non Gi  Result Date: 11/07/2018 CLINICAL DATA:  Left lower quadrant diverticular abscess, status post percutaneous drain EXAM: ABSCESS INJECTION CONTRAST:  10 cc Omnipaque 300 FLUOROSCOPY TIME:  Fluoroscopy Time:  1 minutes Radiation Exposure Index (if provided by the fluoroscopic device): 105 mGy Number of Acquired Spot Images: 3 COMPARISON:  10/24/2018 FINDINGS: Existing left lower quadrant abscess drain was injected with contrast. Fluoroscopic imaging performed. Previous  fistula track to the sigmoid colon has resolved. Abscess cavity has resolved. Contrast tracks along the drain catheter in a retrograde fashion to the skin. Catheter will be removed today. IMPRESSION: Resolved fistula to the sigmoid colon. Electronically Signed   By: Jerilynn Mages.  Ieisha Gao M.D.   On: 11/07/2018 10:36   Dg Sinus/fist Tube Chk-non Gi  Result Date: 10/24/2018 INDICATION: 63 year old with colonic diverticular abscess and percutaneous  drainage catheter. Patient has a persistent colonic fistula and presents for follow-up. EXAM: DRAIN INJECTION WITH FLUOROSCOPY MEDICATIONS: None ANESTHESIA/SEDATION: None COMPLICATIONS: None immediate. PROCEDURE: The drain was injected by Monia Sabal, PA-C. 10 mL Omnipaque 300 was injected. Tube was injected under fluoroscopic guidance. Tube was connected to gravity bag at the end of the procedure. FINDINGS: Drainage catheter is in stable position in the left lower abdomen. There is persistent small fistula track to the colon. Findings have minimally changed from the previous examination. Some contrast is refluxing around the catheter. There is a small focus of contrast near the radiopaque marker of the catheter. No significant abscess cavity. 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 Electronically Signed   By: Markus Daft M.D.   On: 10/24/2018 13:50   Ir Radiologist Eval & Mgmt  Result Date: 11/07/2018 Please refer to notes tab for details about interventional procedure. (Op Note)  Ir Radiologist Eval & Mgmt  Result Date: 10/24/2018 Please refer to notes tab for details about interventional procedure. (Op Note)   Labs:  CBC: Recent Labs    08/12/18 0317 08/13/18 0919 08/14/18 0439 08/15/18 0411  WBC 14.5* 13.3* 12.3* 10.9*  HGB 11.9* 13.0 12.8 12.2  HCT 36.8 40.2 39.8 38.1  PLT 369 401* 390 399    COAGS: Recent Labs    08/11/18 0831  INR 1.1    BMP: Recent Labs    08/10/18 0941 08/11/18 0250 08/14/18 0439 08/15/18 0411  NA 141 140  142 142  K 3.8 3.7 3.5 3.6  CL 108 107 107 108  CO2 23 24 26 25   GLUCOSE 122* 113* 105* 99  BUN 7* 6* <5* <5*  CALCIUM 8.4* 8.4* 8.3* 8.6*  CREATININE 0.46 0.50 0.52 0.46  GFRNONAA >60 >60 >60 >60  GFRAA >60 >60 >60 >60    LIVER FUNCTION TESTS: Recent Labs    08/01/18 2037  BILITOT 1.2  AST 19  ALT 33  ALKPHOS 79  PROT 8.1  ALBUMIN 4.3      Assessment and Plan:  Resolved left lower quadrant diverticular abscess and fistula to the adjacent colon.  Drain catheter removed today.  She has outpatient follow-up with general surgery.   Electronically Signed: Greggory Keen 11/07/2018, 10:38 AM   I spent a total of    15 Minutes in face to face in clinical consultation, greater than 50% of which was counseling/coordinating care for this patient with a left lower quadrant diverticular abscess drain

## 2018-11-14 ENCOUNTER — Encounter: Payer: Self-pay | Admitting: Gastroenterology

## 2018-11-27 ENCOUNTER — Other Ambulatory Visit: Payer: Self-pay | Admitting: Urology

## 2018-11-28 ENCOUNTER — Other Ambulatory Visit: Payer: Self-pay | Admitting: Urology

## 2018-11-29 ENCOUNTER — Ambulatory Visit: Payer: Self-pay | Admitting: Surgery

## 2018-11-29 NOTE — H&P (Signed)
CC: F/u after hospitalization for diverticulitis  HPI: Kristie Allen is a very pleasant 55yoF with hx of HTN, HLD, DM, hypothyroidism is admitted to [facility] 08/01/2018 with her first attack of left lower quadrant pain area just evaluated and found to have diverticulitis. She is admitted for 2 days to the medicine service and improved and was discharged on Augmentin. She returned to the hospital 2 days later with recurrent/worsening left lower quadrant pain and fevers. She is reevaluated and a CT scan demonstrated a perisigmoidal abscess posterior to her sigmoid colon. She was admitted, placed antibiotics and underwent percutaneous drainage by IR on 08/12/2018. She had improvement following all of this. She has some light brown-colored output. She improved and was discharged home 08/15/2018. She underwent drain study 08/29/2018 which demonstrated a thin fistulous connection to the sigmoid colon. Over the last 3 or 4 days she notes that the drain character has cleared up and is no longer feculent odor. She is scheduled for repeat drain injection 09/26/2018. She denies any complaints today. She denies any fever/chills/nausea/vomiting. She denies any abdominal pain. She is having regular bowel movements and tolerating a diet  She reports her last colonoscopy was when she was 75, 11 years ago in [place]. She does not recall any abnormal findings  INTERVAL HX She underwent colonoscopy 11/05/2018 with Dr. Fuller Plan - 6 diminutive polyps were removed. She also had diverticula noted in the sigmoid colon with spasm and some mild peridiverticular erythema. Internal hemorrhoids. She underwent repeat drain study 11/07/2018 which demonstrated resolution of the fistulous tract and interventional radiology removed her drain. She returns today for follow-up and further discussion. She denies any significant complaints today. She denies any fever/chills/nausea/vomiting. She still has intermittent crampy left lower  quadrant "gas" pains. These are typically self-limited and last a few minutes to an hour. She is intermittently taking fiber supplementation.  PMH: HTN (well controlled on oral antihypertensive), HLD (well controlled on statin), DM (well-controlled on oral hypoglycemics); hypothyroidism (well-controlled on Synthroid)  PSH: She denies any prior abdominal surgical history including BTL or c-sx  FHx: Denies FHx of malignancy  Social: Denies use of tobacco/drugs; quit smoking in 1984. Rare social EtOH use. 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  ROS: A comprehensive 10 system review of systems was completed with the patient and pertinent findings as noted above.  The patient is a 63 year old female.   Allergies [facility] [place], CMA; 11/14/2018 10:46 AM) Bisphosphonates  Allergies Reconciled   Medication History (Sabrina Canty, CMA; 11/14/2018 10:46 AM) traMADol HCl (50MG  Tablet, Oral) Active. Atorvastatin Calcium (80MG  Tablet, Oral) Active. Ezetimibe (10MG  Tablet, Oral) Active. Levothyroxine Sodium (125MCG Tablet, Oral) Active. Lisinopril-hydroCHLOROthiazide (20-12.5MG  Tablet, Oral) Active. Pantoprazole Sodium (40MG  Tablet DR, Oral) Active. Synjardy XR (25-1000MG  Tablet ER 24HR, Oral) Active. Meloxicam (7.5MG  Tablet, Oral) Active. Medications Reconciled    Review of Systems Harrell Gave M. Amelda Hapke MD; 11/14/2018 11:15 AM) General Not Present- Appetite Loss, Chills, Fatigue, Fever, Night Sweats, Weight Gain and Weight Loss. Skin Not Present- Change in Wart/Mole, Dryness, Hives, Jaundice, New Lesions, Non-Healing Wounds, Rash and Ulcer. HEENT Present- Seasonal Allergies and Wears glasses/contact lenses. Not Present- Earache, Hearing Loss, Hoarseness, Nose Bleed, Oral Ulcers, Ringing in the Ears, Sinus Pain, Sore Throat, Visual Disturbances and Yellow Eyes. Respiratory Present- Snoring. Not Present- Bloody sputum, Chronic Cough, Difficulty Breathing and Wheezing. Breast  Not Present- Breast Mass, Breast Pain, Nipple Discharge and Skin Changes. Cardiovascular Not Present- Chest Pain, Difficulty Breathing Lying Down, Leg Cramps, Palpitations, Rapid Heart  Rate, Shortness of Breath and Swelling of Extremities. Gastrointestinal Not Present- Abdominal Pain, Bloating, Bloody Stool, Change in Bowel Habits, Chronic diarrhea, Constipation, Difficulty Swallowing, Excessive gas, Gets full quickly at meals, Hemorrhoids, Indigestion, Nausea, Rectal Pain and Vomiting. Female Genitourinary Present- Frequency. Not Present- Nocturia, Painful Urination, Pelvic Pain and Urgency. Musculoskeletal Not Present- Back Pain, Joint Pain, Joint Stiffness, Muscle Pain, Muscle Weakness and Swelling of Extremities. Neurological Not Present- Decreased Memory, Fainting, Headaches, Numbness, Seizures, Tingling, Tremor, Trouble walking and Weakness. Psychiatric Not Present- Anxiety, Bipolar, Change in Sleep Pattern, Depression, Fearful and Frequent crying. Endocrine Not Present- Cold Intolerance, Excessive Hunger, Hair Changes, Heat Intolerance, Hot flashes and New Diabetes. Hematology Not Present- Blood Thinners, Easy Bruising, Excessive bleeding, Gland problems, HIV and Persistent Infections.  Vitals (Sabrina Canty CMA; 11/14/2018 10:47 AM) 11/14/2018 10:46 AM Weight: 248.13 lb Height: 64in Body Surface Area: 2.14 m Body Mass Index: 42.59 kg/m  Temp.: 97.61F (Temporal)  Pulse: 95 (Regular)  P.OX: 96% (Room air) BP: 124/78(Sitting, Left Arm, Standard)       Physical Exam Harrell Gave M. Signa Cheek MD; 11/14/2018 11:16 AM) The physical exam findings are as follows: Note: Constitutional: No acute distress; conversant; no deformities Eyes: Moist conjunctiva; no lid lag; anicteric sclerae; pupils equal round and reactive to light Neck: Trachea midline; no palpable thyromegaly Lungs: Normal respiratory effort; no tactile fremitus CV: Regular rate and rhythm; no palpable thrill; no  pitting edema GI: Abdomen obese, soft, nontender to even deep palpation, nondistended; no palpable hepatosplenomegaly. MSK: Normal gait; no clubbing/cyanosis Psychiatric: Appropriate affect; alert and oriented 3 Lymphatic: No palpable cervical or axillary lymphadenopathy    Assessment & Plan Harrell Gave M. Corey Caulfield MD; 11/14/2018 11:20 AM)  ABSCESS OF SIGMOID COLON DUE TO DIVERTICULITIS (K57.20) Story: Kristie Allen is a very pleasant 65yoF with hx of HTN, HLD, DM, hypothyroidism here today for follow-up of complicated diverticulitis managed with percutaneous drain placement. Drain fistula resolved radiographically and IR removed this 7/22. Her abscess appeared to be posterior to the colon abutting the retroperitoneum Colonoscopy 11/05/2018 - diminutive polyps removed - 6 in total Impression: -The anatomy and physiology of the GI tract was discussed at length with the patient again today. The pathophysiology of diverticulitis was discussed at length with associated pictures using our diverticulitis handout -We re-discussed options going forward. We discussed with surgery, recurrence rates of diverticulitis in the setting of prior perforation with abscess necessitating drain placement is high and the rationale for surgery would be to reduce the risk of this kind of recurrence. She experienced quite a bit of stress related to this and does not want to go through this again. -We discussed minimally invasive (robot assisted) and potentially open techniques for sigmoid colectomy. We discussed urology involvement for ureteral cystoscopy/ureteral stents as well -The procedure, material risks (including, but not limited to, pain, bleeding, infection, scarring, need for blood transfusion, damage to surrounding structures- blood vessels/nerves/viscus/organs, damage to ureter, urine leak, leak from anastomosis, need for additional procedures, need for stoma which may be permanent, contraction of COVID-19, hernia,  recurrence, pneumonia, heart attack, stroke, death) benefits and alternatives to surgery were discussed at length. I noted a good probability that the procedure would help through risk reduction of recurrence. The patient's questions were answered to her satisfaction, she voiced understanding and wishes to proceed with surgery. Additionally, we discussed typical postoperative expectations and the recovery process. -She is out of sick time and discussing dates for surgery with her work - she is complaining to reach back out to [place]  and let us know the date that is convenient for her- she is currently favoring October or November. We discussed her calling us and letting us know -ER warnings also reviewed for recurrent symptoms, f/c/n/v  Signed electronically by [name], MD (11/14/2018 11:20 AM)

## 2018-12-03 ENCOUNTER — Other Ambulatory Visit: Payer: Self-pay | Admitting: Urology

## 2019-01-02 NOTE — Patient Instructions (Addendum)
DUE TO COVID-19 ONLY ONE VISITOR IS ALLOWED TO COME WITH YOU AND STAY IN THE WAITING ROOM ONLY DURING PRE OP AND PROCEDURE DAY OF SURGERY. THE 1 VISITOR MAY VISIT WITH YOU AFTER SURGERY IN YOUR PRIVATE ROOM DURING VISITING HOURS ONLY!  YOU NEED TO HAVE A COVID 19 TEST ON 01-07-19 AT 330 PM, THIS TEST MUST BE DONE BEFORE SURGERY, COME  [place], [address] [place] , [address].  ([place]) ONCE YOUR COVID TEST IS COMPLETED, PLEASE BEGIN THE QUARANTINE INSTRUCTIONS AS OUTLINED IN YOUR HANDOUT.                Kristie Allen     Your procedure is scheduled on: 01-10-2019   Report to [facility] Main  Entrance   Bring CPAP mask and tubing.    Report to admitting at 915 AM     Call this number if you have problems the morning of surgery [phone]    Remember:  [place], NO [place].  NO FOOD AFTER MIDNIGHT Tuesday 01-08-2019. CLEAR LIQUIDS ALL DAY Wednesday 01-09-2019. FOLLOW ALL BOWEL PREP INSTRUCTIONS FROM DR WHITE.   DRINK 2 PRESURGERY  G2 DRINKS THE NIGHT BEFORE SURGERY AT  1000 PM AND 1 PRESURGERY G 2 DRINK THE DAY OF THE PROCEDURE DRINK G 2 AT 815 AM DAY OF SURGERY.   CLEAR LIQUID DIET   Foods Allowed                                                                     Foods Excluded  Coffee and tea, regular and decaf                             liquids that you cannot  Plain Jell-O any favor except red or purple                                           see through such as: Fruit ices (not with fruit pulp)                                     milk, soups, orange juice  Iced Popsicles                                    All solid food Carbonated beverages, regular and diet                                    Cranberry, grape and apple juices Sports drinks like Gatorade Lightly seasoned clear broth or consume(fat free) Sugar, honey syrup  Sample Menu Breakfast                                 Lunch  Supper Cranberry juice                    Beef broth                            Chicken broth Jell-O                                     Grape juice                           Apple juice Coffee or tea                        Jell-O                                      Popsicle                                                Coffee or tea                        Coffee or tea  _____________________________________________________________________     Take these medicines the morning of surgery with A SIP OF WATER: ATORVASTATIN (LIPITOR), EZEMTIMIBE (ZETIA), FLONASE NASAL SPRAY, LEVOTHYROXINE  Call Dr. Dema Severin about stopping 81 mg Aspirin and herbal medications. [phone]  DO NOT TAKE ANY DIABETIC MEDICATIONS DAY OF YOUR SURGERY (Do not take synjardy day before or day of surgery)                               You may not have any metal on your body including hair pins and              piercings  Do not wear jewelry, make-up, lotions, powders or perfumes, deodorant             Do not wear nail polish.  Do not shave  48 hours prior to surgery.              Men may shave face and neck.   Do not bring valuables to the hospital. [place].  Contacts, dentures or bridgework may not be worn into surgery.  Leave suitcase in the car. After surgery it may be brought to your room.     Patients discharged the day of surgery will not be allowed to drive home. IF YOU ARE HAVING SURGERY AND GOING HOME THE SAME DAY, YOU MUST HAVE AN ADULT TO DRIVE YOU HOME AND BE WITH YOU FOR 24 HOURS. YOU MAY GO HOME BY TAXI OR UBER OR ORTHERWISE, BUT AN ADULT MUST ACCOMPANY YOU HOME AND STAY WITH YOU FOR 24 HOURS.  Name and phone number of your driver:  Special Instructions: N/A              Please read over the following fact sheets you were given: _____________________________________________________________________  [place] - Preparing for Surgery Before surgery, you can play an important role.  Because skin is not sterile, your skin needs to be as free of germs as possible.  You can reduce the number of germs on your skin by washing with CHG (chlorahexidine gluconate) soap before surgery.  CHG is an antiseptic cleaner which kills germs and bonds with the skin to continue killing germs even after washing. Please DO NOT use if you have an allergy to CHG or antibacterial soaps.  If your skin becomes reddened/irritated stop using the CHG and inform your nurse when you arrive at Short Stay. Do not shave (including legs and underarms) for at least 48 hours prior to the first CHG shower.  You may shave your face/neck. Please follow these instructions carefully:  1.  Shower with CHG Soap the night before surgery and the  morning of Surgery.  2.  If you choose to wash your hair, wash your hair first as usual with your  normal  shampoo.  3.  After you shampoo, rinse your hair and body thoroughly to remove the  shampoo.                           4.  Use CHG as you would any other liquid soap.  You can apply chg directly  to the skin and wash                       Gently with a scrungie or clean washcloth.  5.  Apply the CHG Soap to your body ONLY FROM THE NECK DOWN.   Do not use on face/ open                           Wound or open sores. Avoid contact with eyes, ears mouth and genitals (private parts).                       Wash face,  Genitals (private parts) with your normal soap.             6.  Wash thoroughly, paying special attention to the area where your surgery  will be performed.  7.  Thoroughly rinse your body with warm water from the neck down.  8.  DO NOT shower/wash with your normal soap after using and rinsing off  the CHG Soap.                9.  Pat yourself dry with a clean towel.            10.  Wear clean pajamas.            11.  Place clean sheets on your bed the night of your first  shower and do not  sleep with pets. Day of Surgery : Do not apply any lotions/deodorants the morning of surgery.  Please wear clean clothes to the hospital/surgery center.  FAILURE TO FOLLOW THESE INSTRUCTIONS MAY RESULT IN THE CANCELLATION OF YOUR SURGERY PATIENT SIGNATURE_________________________________  NURSE SIGNATURE__________________________________  ________________________________________________________________________   Adam Phenix  An incentive spirometer is a tool that can help keep your lungs clear and active. This tool measures how well you are filling your lungs with each breath. Taking long deep breaths may help reverse or decrease the chance of developing breathing (pulmonary) problems (especially infection) following:  A long period of time  when you are unable to move or be active. BEFORE THE PROCEDURE   If the spirometer includes an indicator to show your best effort, your nurse or respiratory therapist will set it to a desired goal.  If possible, sit up straight or lean slightly forward. Try not to slouch.  Hold the incentive spirometer in an upright position. INSTRUCTIONS FOR USE  1. Sit on the edge of your bed if possible, or sit up as far as you can in bed or on a chair. 2. Hold the incentive spirometer in an upright position. 3. Breathe out normally. 4. Place the mouthpiece in your mouth and seal your lips tightly around it. 5. Breathe in slowly and as deeply as possible, raising the piston or the ball toward the top of the column. 6. Hold your breath for 3-5 seconds or for as long as possible. Allow the piston or ball to fall to the bottom of the column. 7. Remove the mouthpiece from your mouth and breathe out normally. 8. Rest for a few seconds and repeat Steps 1 through 7 at least 10 times every 1-2 hours when you are awake. Take your time and take a few normal breaths between deep breaths. 9. The spirometer may include an indicator to show your  best effort. Use the indicator as a goal to work toward during each repetition. 10. After each set of 10 deep breaths, practice coughing to be sure your lungs are clear. If you have an incision (the cut made at the time of surgery), support your incision when coughing by placing a pillow or rolled up towels firmly against it. Once you are able to get out of bed, walk around indoors and cough well. You may stop using the incentive spirometer when instructed by your caregiver.  RISKS AND COMPLICATIONS  Take your time so you do not get dizzy or light-headed.  If you are in pain, you may need to take or ask for pain medication before doing incentive spirometry. It is harder to take a deep breath if you are having pain. AFTER USE  Rest and breathe slowly and easily.  It can be helpful to keep track of a log of your progress. Your caregiver can provide you with a simple table to help with this. If you are using the spirometer at home, follow these instructions: [place] IF:   You are having difficultly using the spirometer.  You have trouble using the spirometer as often as instructed.  Your pain medication is not giving enough relief while using the spirometer.  You develop fever of 100.5 F (38.1 C) or higher. SEEK IMMEDIATE MEDICAL CARE IF:   You cough up bloody sputum that had not been present before.  You develop fever of 102 F (38.9 C) or greater.  You develop worsening pain at or near the incision site. MAKE SURE YOU:   Understand these instructions.  Will watch your condition.  Will get help right away if you are not doing well or get worse. Document Released: 08/15/2006 Document Revised: 06/27/2011 Document Reviewed: 10/16/2006 ExitCare Patient Information 2014 Exit[employer], [place].   ________________________________________________________________________  WHAT IS A BLOOD TRANSFUSION? Blood Transfusion Information  A transfusion is the replacement of blood or  some of its parts. Blood is made up of multiple cells which provide different functions.  Red blood cells carry oxygen and are used for blood loss replacement.  White blood cells fight against infection.  Platelets control bleeding.  Plasma helps clot blood.  Other blood products are available for specialized needs, such as hemophilia or other clotting disorders. BEFORE THE TRANSFUSION  Who gives blood for transfusions?   Healthy volunteers who are fully evaluated to make sure their blood is safe. This is blood bank blood. Transfusion therapy is the safest it has ever been in the practice of medicine. Before blood is taken from a donor, a complete history is taken to make sure that person has no history of diseases nor engages in risky social behavior (examples are intravenous drug use or sexual activity with multiple partners). The donor's travel history is screened to minimize risk of transmitting infections, such as malaria. The donated blood is tested for signs of infectious diseases, such as HIV and hepatitis. The blood is then tested to be sure it is compatible with you in order to minimize the chance of a transfusion reaction. If you or a relative donates blood, this is often done in anticipation of surgery and is not appropriate for emergency situations. It takes many days to process the donated blood. RISKS AND COMPLICATIONS Although transfusion therapy is very safe and saves many lives, the main dangers of transfusion include:   Getting an infectious disease.  Developing a transfusion reaction. This is an allergic reaction to something in the blood you were given. Every precaution is taken to prevent this. The decision to have a blood transfusion has been considered carefully by your caregiver before blood is given. Blood is not given unless the benefits outweigh the risks. AFTER THE TRANSFUSION  Right after receiving a blood transfusion, you will usually feel much better and more  energetic. This is especially true if your red blood cells have gotten low (anemic). The transfusion raises the level of the red blood cells which carry oxygen, and this usually causes an energy increase.  The nurse administering the transfusion will monitor you carefully for complications. HOME CARE INSTRUCTIONS  No special instructions are needed after a transfusion. You may find your energy is better. Speak with your caregiver about any limitations on activity for underlying diseases you may have. SEEK MEDICAL CARE IF:   Your condition is not improving after your transfusion.  You develop redness or irritation at the intravenous (IV) site. SEEK IMMEDIATE MEDICAL CARE IF:  Any of the following symptoms occur over the next 12 hours:  Shaking chills.  You have a temperature by mouth above 102 F (38.9 C), not controlled by medicine.  Chest, back, or muscle pain.  People around you feel you are not acting correctly or are confused.  Shortness of breath or difficulty breathing.  Dizziness and fainting.  You get a rash or develop hives.  You have a decrease in urine output.  Your urine turns a dark color or changes to pink, red, or brown. Any of the following symptoms occur over the next 10 days:  You have a temperature by mouth above 102 F (38.9 C), not controlled by medicine.  Shortness of breath.  Weakness after normal activity.  The white part of the eye turns yellow (jaundice).  You have a decrease in the amount of urine or are urinating less often.  Your urine turns a dark color or changes to pink, red, or brown. Document Released: 04/01/2000 Document Revised: 06/27/2011 Document Reviewed: 11/19/2007 [facility] Patient Information 2014 [place], [place].  _______________________________________________________________________

## 2019-01-04 ENCOUNTER — Encounter (HOSPITAL_COMMUNITY)
Admission: RE | Admit: 2019-01-04 | Discharge: 2019-01-04 | Disposition: A | Discharge status: Home / Self Care | Payer: PRIVATE HEALTH INSURANCE | Source: Ambulatory Visit | Attending: Surgery | Admitting: Surgery

## 2019-01-04 ENCOUNTER — Other Ambulatory Visit: Payer: Self-pay

## 2019-01-04 ENCOUNTER — Encounter (HOSPITAL_COMMUNITY): Payer: Self-pay

## 2019-01-04 DIAGNOSIS — K5792 Diverticulitis of intestine, part unspecified, without perforation or abscess without bleeding: Secondary | ICD-10-CM | POA: Insufficient documentation

## 2019-01-04 DIAGNOSIS — Z01818 Encounter for other preprocedural examination: Secondary | ICD-10-CM | POA: Insufficient documentation

## 2019-01-04 HISTORY — DX: Sleep apnea, unspecified: G47.30

## 2019-01-04 HISTORY — DX: Gastro-esophageal reflux disease without esophagitis: K21.9

## 2019-01-04 LAB — CBC WITH DIFFERENTIAL/PLATELET
Abs Immature Granulocytes: 0.07 10*3/uL (ref 0.00–0.07)
Basophils Absolute: 0.1 10*3/uL (ref 0.0–0.1)
Basophils Relative: 1 %
Eosinophils Absolute: 0.1 10*3/uL (ref 0.0–0.5)
Eosinophils Relative: 1 %
HCT: 44.8 % (ref 36.0–46.0)
Hemoglobin: 13.9 g/dL (ref 12.0–15.0)
Immature Granulocytes: 1 %
Lymphocytes Relative: 14 %
Lymphs Abs: 1.7 10*3/uL (ref 0.7–4.0)
MCH: 26.5 pg (ref 26.0–34.0)
MCHC: 31 g/dL (ref 30.0–36.0)
MCV: 85.5 fL (ref 80.0–100.0)
Monocytes Absolute: 0.8 10*3/uL (ref 0.1–1.0)
Monocytes Relative: 7 %
Neutro Abs: 9.5 10*3/uL — ABNORMAL HIGH (ref 1.7–7.7)
Neutrophils Relative %: 76 %
Platelets: 340 10*3/uL (ref 150–400)
RBC: 5.24 MIL/uL — ABNORMAL HIGH (ref 3.87–5.11)
RDW: 13.7 % (ref 11.5–15.5)
WBC: 12.2 10*3/uL — ABNORMAL HIGH (ref 4.0–10.5)
nRBC: 0 % (ref 0.0–0.2)

## 2019-01-04 LAB — COMPREHENSIVE METABOLIC PANEL
ALT: 19 U/L (ref 0–44)
AST: 14 U/L — ABNORMAL LOW (ref 15–41)
Albumin: 4 g/dL (ref 3.5–5.0)
Alkaline Phosphatase: 85 U/L (ref 38–126)
Anion gap: 11 (ref 5–15)
BUN: 18 mg/dL (ref 8–23)
CO2: 26 mmol/L (ref 22–32)
Calcium: 9.6 mg/dL (ref 8.9–10.3)
Chloride: 101 mmol/L (ref 98–111)
Creatinine, Ser: 0.7 mg/dL (ref 0.44–1.00)
GFR calc Af Amer: >60 mL/min (ref >60)
GFR calc non Af Amer: >60 mL/min (ref >60)
Glucose, Bld: 117 mg/dL — ABNORMAL HIGH (ref 70–99)
Potassium: 4.8 mmol/L (ref 3.5–5.1)
Sodium: 138 mmol/L (ref 135–145)
Total Bilirubin: 0.4 mg/dL (ref 0.3–1.2)
Total Protein: 7.7 g/dL (ref 6.5–8.1)

## 2019-01-04 LAB — HEMOGLOBIN A1C
Hgb A1c MFr Bld: 7.1 % — ABNORMAL HIGH (ref 4.8–5.6)
Mean Plasma Glucose: 157.07 mg/dL

## 2019-01-04 LAB — PROTIME-INR
INR: 1 (ref 0.8–1.2)
Prothrombin Time: 13.1 seconds (ref 11.4–15.2)

## 2019-01-04 LAB — APTT: aPTT: 27 seconds (ref 24–36)

## 2019-01-04 LAB — ABO/RH: ABO/RH(D): A POS

## 2019-01-04 LAB — GLUCOSE, CAPILLARY: Glucose-Capillary: 123 mg/dL — ABNORMAL HIGH (ref 70–99)

## 2019-01-04 NOTE — Pre-Procedure Instructions (Signed)
PCP - Dr. Ward Givens  Cardiologist - none  Chest x-ray -  EKG - 01/04/2019 epic Stress Test -  none ECHO - none Cardiac Cath - none  Sleep Study - 01/01/2018 care everywhere CPAP - not sure of settings, uses twice weekly  Fasting Blood Sugar - unsure Checks Blood Sugar _rarely__  Blood Thinner Instructions: none Aspirin Instructions: 81mg , pt to call Dr. Dema Severin for instructions on stopping.   Last Dose:  Anesthesia review: Chart to Konrad Felix PA  Patient denies shortness of breath, fever, cough and chest pain at PAT appointment   Patient verbalized understanding of instructions that were given to them at the PAT appointment. Patient was also instructed that they will need to review over the PAT instructions again at home before surgery.

## 2019-01-07 ENCOUNTER — Other Ambulatory Visit (HOSPITAL_COMMUNITY)
Admission: RE | Admit: 2019-01-07 | Discharge: 2019-01-07 | Disposition: A | Discharge status: Home / Self Care | Payer: PRIVATE HEALTH INSURANCE | Source: Ambulatory Visit | Attending: Surgery | Admitting: Surgery

## 2019-01-07 DIAGNOSIS — Z01812 Encounter for preprocedural laboratory examination: Secondary | ICD-10-CM | POA: Insufficient documentation

## 2019-01-07 DIAGNOSIS — Z20828 Contact with and (suspected) exposure to other viral communicable diseases: Secondary | ICD-10-CM | POA: Insufficient documentation

## 2019-01-09 LAB — NOVEL CORONAVIRUS, NAA (HOSP ORDER, SEND-OUT TO REF LAB; TAT 18-24 HRS): SARS-CoV-2, NAA: NOT DETECTED

## 2019-01-09 MED ORDER — BUPIVACAINE LIPOSOME 1.3 % IJ SUSP
20.0000 mL | INTRAMUSCULAR | Status: DC
  Filled 2019-01-09: qty 20

## 2019-01-09 NOTE — Anesthesia Preprocedure Evaluation (Addendum)
Anesthesia Evaluation  Patient identified by MRN, date of birth, ID band Patient awake    Reviewed: Allergy & Precautions, NPO status , Patient's Chart, lab work & pertinent test results  Airway Mallampati: II  TM Distance: >3 FB Neck ROM: Full    Dental no notable dental hx. (+) Dental Advisory Given, Teeth Intact,    Pulmonary sleep apnea and Continuous Positive Airway Pressure Ventilation , former smoker,    Pulmonary exam normal breath sounds clear to auscultation       Cardiovascular Exercise Tolerance: Good hypertension, Pt. on medications Normal cardiovascular exam Rhythm:Regular Rate:Normal     Neuro/Psych negative neurological ROS  negative psych ROS   GI/Hepatic Neg liver ROS, GERD  Medicated,  Endo/Other  diabetes, Type 2Hypothyroidism   Renal/GU K+ 4.8  negative genitourinary   Musculoskeletal negative musculoskeletal ROS (+)   Abdominal (+) + obese,   Peds  Hematology Hgb 13.9   Anesthesia Other Findings   Reproductive/Obstetrics                            Anesthesia Physical Anesthesia Plan  ASA: III  Anesthesia Plan: General   Post-op Pain Management:    Induction: Intravenous  PONV Risk Score and Plan: 3 and Treatment may vary due to age or medical condition, Dexamethasone and Ondansetron  Airway Management Planned: Oral ETT  Additional Equipment: None  Intra-op Plan:   Post-operative Plan: Extubation in OR  Informed Consent: I have reviewed the patients History and Physical, chart, labs and discussed the procedure including the risks, benefits and alternatives for the proposed anesthesia with the patient or authorized representative who has indicated his/her understanding and acceptance.     Dental advisory given  Plan Discussed with: CRNA  Anesthesia Plan Comments: (GA w lidocaine infusion)       Anesthesia Quick Evaluation

## 2019-01-10 ENCOUNTER — Inpatient Hospital Stay (HOSPITAL_COMMUNITY): Payer: PRIVATE HEALTH INSURANCE

## 2019-01-10 ENCOUNTER — Encounter (HOSPITAL_COMMUNITY)
Admission: RE | Disposition: A | Discharge status: Home / Self Care | Payer: Self-pay | Source: Home / Self Care | Attending: Surgery

## 2019-01-10 ENCOUNTER — Inpatient Hospital Stay (HOSPITAL_COMMUNITY): Payer: PRIVATE HEALTH INSURANCE | Admitting: Physician Assistant

## 2019-01-10 ENCOUNTER — Other Ambulatory Visit: Payer: Self-pay

## 2019-01-10 ENCOUNTER — Encounter (HOSPITAL_COMMUNITY): Payer: Self-pay

## 2019-01-10 ENCOUNTER — Inpatient Hospital Stay (HOSPITAL_COMMUNITY)
Admission: RE | Admit: 2019-01-10 | Discharge: 2019-01-13 | DRG: 329 | Disposition: A | Discharge status: Home / Self Care | Payer: PRIVATE HEALTH INSURANCE | Source: Ambulatory Visit | Attending: Surgery | Admitting: Surgery

## 2019-01-10 DIAGNOSIS — I10 Essential (primary) hypertension: Secondary | ICD-10-CM | POA: Diagnosis present

## 2019-01-10 DIAGNOSIS — K5720 Diverticulitis of large intestine with perforation and abscess without bleeding: Secondary | ICD-10-CM | POA: Diagnosis present

## 2019-01-10 DIAGNOSIS — K219 Gastro-esophageal reflux disease without esophagitis: Secondary | ICD-10-CM | POA: Diagnosis present

## 2019-01-10 DIAGNOSIS — Z87891 Personal history of nicotine dependence: Secondary | ICD-10-CM

## 2019-01-10 DIAGNOSIS — Z01812 Encounter for preprocedural laboratory examination: Secondary | ICD-10-CM | POA: Diagnosis not present

## 2019-01-10 DIAGNOSIS — E785 Hyperlipidemia, unspecified: Secondary | ICD-10-CM | POA: Diagnosis present

## 2019-01-10 DIAGNOSIS — E1140 Type 2 diabetes mellitus with diabetic neuropathy, unspecified: Secondary | ICD-10-CM | POA: Diagnosis present

## 2019-01-10 DIAGNOSIS — E039 Hypothyroidism, unspecified: Secondary | ICD-10-CM | POA: Diagnosis present

## 2019-01-10 DIAGNOSIS — G4730 Sleep apnea, unspecified: Secondary | ICD-10-CM | POA: Diagnosis present

## 2019-01-10 DIAGNOSIS — K6819 Other retroperitoneal abscess: Secondary | ICD-10-CM | POA: Diagnosis present

## 2019-01-10 DIAGNOSIS — Z888 Allergy status to other drugs, medicaments and biological substances status: Secondary | ICD-10-CM

## 2019-01-10 DIAGNOSIS — Z86010 Personal history of colonic polyps: Secondary | ICD-10-CM

## 2019-01-10 DIAGNOSIS — Z8249 Family history of ischemic heart disease and other diseases of the circulatory system: Secondary | ICD-10-CM | POA: Diagnosis not present

## 2019-01-10 DIAGNOSIS — Z833 Family history of diabetes mellitus: Secondary | ICD-10-CM

## 2019-01-10 DIAGNOSIS — F339 Major depressive disorder, recurrent, unspecified: Secondary | ICD-10-CM | POA: Diagnosis present

## 2019-01-10 DIAGNOSIS — Z20828 Contact with and (suspected) exposure to other viral communicable diseases: Secondary | ICD-10-CM | POA: Diagnosis present

## 2019-01-10 DIAGNOSIS — Z6841 Body Mass Index (BMI) 40.0 and over, adult: Secondary | ICD-10-CM | POA: Diagnosis not present

## 2019-01-10 DIAGNOSIS — G4700 Insomnia, unspecified: Secondary | ICD-10-CM | POA: Diagnosis present

## 2019-01-10 DIAGNOSIS — Z9049 Acquired absence of other specified parts of digestive tract: Secondary | ICD-10-CM

## 2019-01-10 DIAGNOSIS — Z8719 Personal history of other diseases of the digestive system: Secondary | ICD-10-CM

## 2019-01-10 HISTORY — PX: FLEXIBLE SIGMOIDOSCOPY: SHX5431

## 2019-01-10 HISTORY — PX: CYSTOSCOPY WITH STENT PLACEMENT: SHX5790

## 2019-01-10 LAB — TYPE AND SCREEN
ABO/RH(D): A POS
Antibody Screen: NEGATIVE

## 2019-01-10 LAB — GLUCOSE, CAPILLARY
Glucose-Capillary: 150 mg/dL — ABNORMAL HIGH (ref 70–99)
Glucose-Capillary: 178 mg/dL — ABNORMAL HIGH (ref 70–99)
Glucose-Capillary: 173 mg/dL — ABNORMAL HIGH (ref 70–99)
Glucose-Capillary: 198 mg/dL — ABNORMAL HIGH (ref 70–99)

## 2019-01-10 SURGERY — COLECTOMY, PARTIAL, ROBOT-ASSISTED, LAPAROSCOPIC
Anesthesia: General | Site: Ureter

## 2019-01-10 MED ORDER — DIPHENHYDRAMINE HCL 12.5 MG/5ML PO ELIX: 12.5000 mg | ORAL_SOLUTION | Freq: Four times a day (QID) | ORAL | Status: DC | PRN

## 2019-01-10 MED ORDER — BSS IO SOLN
15.0000 mL | Freq: Once | INTRAOCULAR | Status: AC
  Administered 2019-01-10: 15 mL
  Filled 2019-01-10: qty 15

## 2019-01-10 MED ORDER — LACTATED RINGERS IR SOLN
Status: DC | PRN
  Administered 2019-01-10: 1000 mL

## 2019-01-10 MED ORDER — DIPHENHYDRAMINE HCL 50 MG/ML IJ SOLN: 12.5000 mg | Freq: Four times a day (QID) | INTRAMUSCULAR | Status: DC | PRN

## 2019-01-10 MED ORDER — LIDOCAINE 2% (20 MG/ML) 5 ML SYRINGE
INTRAMUSCULAR | Status: AC
  Filled 2019-01-10: qty 5

## 2019-01-10 MED ORDER — GLUCERNA SHAKE PO LIQD
237.0000 mL | Freq: Two times a day (BID) | ORAL | Status: DC
  Administered 2019-01-11 – 2019-01-13 (×3): 237 mL via ORAL
  Filled 2019-01-10 (×9): qty 237

## 2019-01-10 MED ORDER — STERILE WATER FOR INJECTION IJ SOLN
INTRAMUSCULAR | Status: AC
  Filled 2019-01-10: qty 10

## 2019-01-10 MED ORDER — ROCURONIUM BROMIDE 10 MG/ML (PF) SYRINGE
PREFILLED_SYRINGE | INTRAVENOUS | Status: DC | PRN
  Administered 2019-01-10 (×3): 20 mg via INTRAVENOUS
  Administered 2019-01-10: 50 mg via INTRAVENOUS
  Administered 2019-01-10: 20 mg via INTRAVENOUS

## 2019-01-10 MED ORDER — ONDANSETRON HCL 4 MG/2ML IJ SOLN
INTRAMUSCULAR | Status: AC
  Filled 2019-01-10: qty 2

## 2019-01-10 MED ORDER — NEOMYCIN SULFATE 500 MG PO TABS: 1000.0000 mg | ORAL_TABLET | ORAL | Status: DC

## 2019-01-10 MED ORDER — HEPARIN SODIUM (PORCINE) 5000 UNIT/ML IJ SOLN
5000.0000 [IU] | Freq: Once | INTRAMUSCULAR | Status: AC
  Administered 2019-01-10: 5000 [IU] via SUBCUTANEOUS
  Filled 2019-01-10: qty 1

## 2019-01-10 MED ORDER — SUCCINYLCHOLINE CHLORIDE 200 MG/10ML IV SOSY
PREFILLED_SYRINGE | INTRAVENOUS | Status: AC
  Filled 2019-01-10: qty 10

## 2019-01-10 MED ORDER — MIDAZOLAM HCL 5 MG/5ML IJ SOLN
INTRAMUSCULAR | Status: DC | PRN
  Administered 2019-01-10: 2 mg via INTRAVENOUS

## 2019-01-10 MED ORDER — FENTANYL CITRATE (PF) 100 MCG/2ML IJ SOLN: 25.0000 ug | INTRAMUSCULAR | Status: DC | PRN

## 2019-01-10 MED ORDER — INSULIN ASPART 100 UNIT/ML ~~LOC~~ SOLN: 0.0000 [IU] | Freq: Every day | SUBCUTANEOUS | Status: DC

## 2019-01-10 MED ORDER — ALUM & MAG HYDROXIDE-SIMETH 200-200-20 MG/5ML PO SUSP: 30.0000 mL | Freq: Four times a day (QID) | ORAL | Status: DC | PRN

## 2019-01-10 MED ORDER — ROCURONIUM BROMIDE 10 MG/ML (PF) SYRINGE
PREFILLED_SYRINGE | INTRAVENOUS | Status: AC
  Filled 2019-01-10: qty 10

## 2019-01-10 MED ORDER — LEVOTHYROXINE SODIUM 125 MCG PO TABS
125.0000 ug | ORAL_TABLET | Freq: Every day | ORAL | Status: DC
  Administered 2019-01-11 – 2019-01-13 (×3): 125 ug via ORAL
  Filled 2019-01-10 (×3): qty 1

## 2019-01-10 MED ORDER — LIDOCAINE 2% (20 MG/ML) 5 ML SYRINGE
INTRAMUSCULAR | Status: DC | PRN
  Administered 2019-01-10: 100 mg via INTRAVENOUS

## 2019-01-10 MED ORDER — LACTATED RINGERS IV SOLN
INTRAVENOUS | Status: DC
  Administered 2019-01-10 – 2019-01-11 (×3): via INTRAVENOUS

## 2019-01-10 MED ORDER — OXYCODONE HCL 5 MG PO TABS: 5.0000 mg | ORAL_TABLET | Freq: Once | ORAL | Status: DC | PRN

## 2019-01-10 MED ORDER — SUGAMMADEX SODIUM 500 MG/5ML IV SOLN
INTRAVENOUS | Status: AC
  Filled 2019-01-10: qty 5

## 2019-01-10 MED ORDER — LIDOCAINE HCL 2 % IJ SOLN
INTRAMUSCULAR | Status: AC
  Filled 2019-01-10: qty 20

## 2019-01-10 MED ORDER — FENTANYL CITRATE (PF) 250 MCG/5ML IJ SOLN
INTRAMUSCULAR | Status: DC | PRN
  Administered 2019-01-10 (×3): 50 ug via INTRAVENOUS
  Administered 2019-01-10: 100 ug via INTRAVENOUS

## 2019-01-10 MED ORDER — ONDANSETRON HCL 4 MG PO TABS: 4.0000 mg | ORAL_TABLET | Freq: Four times a day (QID) | ORAL | Status: DC | PRN

## 2019-01-10 MED ORDER — FENTANYL CITRATE (PF) 250 MCG/5ML IJ SOLN
INTRAMUSCULAR | Status: AC
  Filled 2019-01-10: qty 5

## 2019-01-10 MED ORDER — INDOCYANINE GREEN 25 MG IV SOLR
INTRAVENOUS | Status: DC | PRN
  Administered 2019-01-10: 6.5 mg via INTRAVENOUS

## 2019-01-10 MED ORDER — KETAMINE HCL 10 MG/ML IJ SOLN
INTRAMUSCULAR | Status: DC | PRN
  Administered 2019-01-10: 30 mg via INTRAVENOUS

## 2019-01-10 MED ORDER — KETOROLAC TROMETHAMINE 0.5 % OP SOLN
1.0000 [drp] | Freq: Three times a day (TID) | OPHTHALMIC | Status: AC | PRN
  Administered 2019-01-10 – 2019-01-11 (×2): 1 [drp] via OPHTHALMIC
  Filled 2019-01-10: qty 5

## 2019-01-10 MED ORDER — TRAMADOL HCL 50 MG PO TABS
50.0000 mg | ORAL_TABLET | Freq: Four times a day (QID) | ORAL | Status: DC | PRN
  Administered 2019-01-10: 50 mg via ORAL
  Filled 2019-01-10 (×2): qty 1

## 2019-01-10 MED ORDER — METRONIDAZOLE 500 MG PO TABS: 1000.0000 mg | ORAL_TABLET | ORAL | Status: DC

## 2019-01-10 MED ORDER — HEPARIN SODIUM (PORCINE) 5000 UNIT/ML IJ SOLN
5000.0000 [IU] | Freq: Three times a day (TID) | INTRAMUSCULAR | Status: DC
  Administered 2019-01-10 – 2019-01-13 (×7): 5000 [IU] via SUBCUTANEOUS
  Filled 2019-01-10 (×8): qty 1

## 2019-01-10 MED ORDER — MEPERIDINE HCL 50 MG/ML IJ SOLN: 6.2500 mg | INTRAMUSCULAR | Status: DC | PRN

## 2019-01-10 MED ORDER — ACETAMINOPHEN 500 MG PO TABS
1000.0000 mg | ORAL_TABLET | Freq: Four times a day (QID) | ORAL | Status: DC
  Administered 2019-01-10 – 2019-01-13 (×10): 1000 mg via ORAL
  Filled 2019-01-10 (×11): qty 2

## 2019-01-10 MED ORDER — ALVIMOPAN 12 MG PO CAPS
12.0000 mg | ORAL_CAPSULE | ORAL | Status: AC
  Administered 2019-01-10: 12 mg via ORAL
  Filled 2019-01-10: qty 1

## 2019-01-10 MED ORDER — FLUTICASONE PROPIONATE 50 MCG/ACT NA SUSP
1.0000 | Freq: Every day | NASAL | Status: DC
  Administered 2019-01-11 – 2019-01-13 (×2): 1 via NASAL
  Filled 2019-01-10 (×2): qty 16

## 2019-01-10 MED ORDER — SUGAMMADEX SODIUM 500 MG/5ML IV SOLN
INTRAVENOUS | Status: DC | PRN
  Administered 2019-01-10: 500 mg via INTRAVENOUS

## 2019-01-10 MED ORDER — INSULIN ASPART 100 UNIT/ML ~~LOC~~ SOLN
0.0000 [IU] | Freq: Three times a day (TID) | SUBCUTANEOUS | Status: DC
  Administered 2019-01-10: 2 [IU] via SUBCUTANEOUS
  Administered 2019-01-11 – 2019-01-13 (×3): 1 [IU] via SUBCUTANEOUS

## 2019-01-10 MED ORDER — MIDAZOLAM HCL 2 MG/2ML IJ SOLN
INTRAMUSCULAR | Status: AC
  Filled 2019-01-10: qty 2

## 2019-01-10 MED ORDER — ATORVASTATIN CALCIUM 80 MG PO TABS
80.0000 mg | ORAL_TABLET | Freq: Every day | ORAL | Status: DC
  Administered 2019-01-10 – 2019-01-12 (×3): 80 mg via ORAL
  Filled 2019-01-10: qty 1
  Filled 2019-01-10 (×2): qty 2
  Filled 2019-01-10 (×2): qty 1
  Filled 2019-01-10: qty 2
  Filled 2019-01-10: qty 1

## 2019-01-10 MED ORDER — OXYCODONE HCL 5 MG/5ML PO SOLN: 5.0000 mg | Freq: Once | ORAL | Status: DC | PRN

## 2019-01-10 MED ORDER — PANTOPRAZOLE SODIUM 40 MG PO TBEC
40.0000 mg | DELAYED_RELEASE_TABLET | Freq: Every day | ORAL | Status: DC
  Administered 2019-01-11 – 2019-01-13 (×3): 40 mg via ORAL
  Filled 2019-01-10 (×3): qty 1

## 2019-01-10 MED ORDER — DEXAMETHASONE SODIUM PHOSPHATE 10 MG/ML IJ SOLN
INTRAMUSCULAR | Status: DC | PRN
  Administered 2019-01-10: 10 mg via INTRAVENOUS

## 2019-01-10 MED ORDER — ONDANSETRON HCL 4 MG/2ML IJ SOLN: 4.0000 mg | Freq: Four times a day (QID) | INTRAMUSCULAR | Status: DC | PRN

## 2019-01-10 MED ORDER — LIDOCAINE 20MG/ML (2%) 15 ML SYRINGE OPTIME
INTRAMUSCULAR | Status: DC | PRN
  Administered 2019-01-10: 1.5 mg/kg/h via INTRAVENOUS

## 2019-01-10 MED ORDER — DEXAMETHASONE SODIUM PHOSPHATE 10 MG/ML IJ SOLN
INTRAMUSCULAR | Status: AC
  Filled 2019-01-10: qty 1

## 2019-01-10 MED ORDER — LISINOPRIL 20 MG PO TABS
20.0000 mg | ORAL_TABLET | Freq: Every day | ORAL | Status: DC
  Administered 2019-01-11 – 2019-01-13 (×2): 20 mg via ORAL
  Filled 2019-01-10 (×3): qty 1

## 2019-01-10 MED ORDER — HYDROMORPHONE HCL 1 MG/ML IJ SOLN
0.5000 mg | INTRAMUSCULAR | Status: DC | PRN
  Administered 2019-01-10 – 2019-01-11 (×3): 0.5 mg via INTRAVENOUS
  Filled 2019-01-10 (×3): qty 0.5

## 2019-01-10 MED ORDER — LACTATED RINGERS IV SOLN
INTRAVENOUS | Status: DC
  Administered 2019-01-10 (×2): via INTRAVENOUS

## 2019-01-10 MED ORDER — POLYETHYLENE GLYCOL 3350 17 GM/SCOOP PO POWD: 1.0000 | Freq: Once | ORAL | Status: DC

## 2019-01-10 MED ORDER — ONDANSETRON HCL 4 MG/2ML IJ SOLN
INTRAMUSCULAR | Status: DC | PRN
  Administered 2019-01-10: 4 mg via INTRAVENOUS

## 2019-01-10 MED ORDER — CHLORHEXIDINE GLUCONATE CLOTH 2 % EX PADS: 6.0000 | MEDICATED_PAD | Freq: Once | CUTANEOUS | Status: DC

## 2019-01-10 MED ORDER — PROPOFOL 10 MG/ML IV BOLUS
INTRAVENOUS | Status: DC | PRN
  Administered 2019-01-10: 150 mg via INTRAVENOUS

## 2019-01-10 MED ORDER — BUPIVACAINE HCL (PF) 0.25 % IJ SOLN
INTRAMUSCULAR | Status: AC
  Filled 2019-01-10: qty 30

## 2019-01-10 MED ORDER — PROPOFOL 10 MG/ML IV BOLUS
INTRAVENOUS | Status: AC
  Filled 2019-01-10: qty 20

## 2019-01-10 MED ORDER — ONDANSETRON HCL 4 MG/2ML IJ SOLN: 4.0000 mg | Freq: Once | INTRAMUSCULAR | Status: DC | PRN

## 2019-01-10 MED ORDER — LISINOPRIL-HYDROCHLOROTHIAZIDE 20-12.5 MG PO TABS: 1.0000 | ORAL_TABLET | Freq: Every day | ORAL | Status: DC

## 2019-01-10 MED ORDER — 0.9 % SODIUM CHLORIDE (POUR BTL) OPTIME
TOPICAL | Status: DC | PRN
  Administered 2019-01-10: 1000 mL

## 2019-01-10 MED ORDER — BUPIVACAINE LIPOSOME 1.3 % IJ SUSP
INTRAMUSCULAR | Status: DC | PRN
  Administered 2019-01-10: 20 mL

## 2019-01-10 MED ORDER — BUPIVACAINE-EPINEPHRINE (PF) 0.25% -1:200000 IJ SOLN
INTRAMUSCULAR | Status: DC | PRN
  Administered 2019-01-10: 30 mL

## 2019-01-10 MED ORDER — ALVIMOPAN 12 MG PO CAPS
12.0000 mg | ORAL_CAPSULE | Freq: Two times a day (BID) | ORAL | Status: DC
  Administered 2019-01-11 – 2019-01-12 (×3): 12 mg via ORAL
  Filled 2019-01-10 (×3): qty 1

## 2019-01-10 MED ORDER — ACETAMINOPHEN 500 MG PO TABS
1000.0000 mg | ORAL_TABLET | ORAL | Status: AC
  Administered 2019-01-10: 1000 mg via ORAL
  Filled 2019-01-10: qty 2

## 2019-01-10 MED ORDER — KETAMINE HCL 10 MG/ML IJ SOLN
INTRAMUSCULAR | Status: AC
  Filled 2019-01-10: qty 1

## 2019-01-10 MED ORDER — IBUPROFEN 200 MG PO TABS: 600.0000 mg | ORAL_TABLET | Freq: Four times a day (QID) | ORAL | Status: DC | PRN

## 2019-01-10 MED ORDER — POLYMYXIN B-TRIMETHOPRIM 10000-0.1 UNIT/ML-% OP SOLN
1.0000 [drp] | Freq: Three times a day (TID) | OPHTHALMIC | Status: AC
  Administered 2019-01-10 – 2019-01-11 (×3): 1 [drp] via OPHTHALMIC
  Filled 2019-01-10: qty 10

## 2019-01-10 MED ORDER — GABAPENTIN 300 MG PO CAPS
300.0000 mg | ORAL_CAPSULE | ORAL | Status: AC
  Administered 2019-01-10: 09:00:00 300 mg via ORAL
  Filled 2019-01-10: qty 1

## 2019-01-10 MED ORDER — SODIUM CHLORIDE 0.9 % IV SOLN
2.0000 g | INTRAVENOUS | Status: AC
  Administered 2019-01-10: 2 g via INTRAVENOUS
  Filled 2019-01-10: qty 2

## 2019-01-10 MED ORDER — HYDROCHLOROTHIAZIDE 12.5 MG PO CAPS
12.5000 mg | ORAL_CAPSULE | Freq: Every day | ORAL | Status: DC
  Administered 2019-01-11 – 2019-01-13 (×2): 12.5 mg via ORAL
  Filled 2019-01-10 (×3): qty 1

## 2019-01-10 MED ORDER — ACETAMINOPHEN 10 MG/ML IV SOLN: 1000.0000 mg | Freq: Once | INTRAVENOUS | Status: DC | PRN

## 2019-01-10 SURGICAL SUPPLY — 112 items
ADAPTER GOLDBERG URETERAL (ADAPTER) ×5 IMPLANT
APPLIER CLIP 5 13 M/L LIGAMAX5 (MISCELLANEOUS)
APPLIER CLIP ROT 10 11.4 M/L (STAPLE)
BAG URO CATCHER STRL LF (MISCELLANEOUS) ×5 IMPLANT
BLADE EXTENDED COATED 6.5IN (ELECTRODE) IMPLANT
CANNULA REDUC XI 12-8 STAPL (CANNULA) ×1
CANNULA REDUC XI 12-8MM STAPL (CANNULA) ×1
CANNULA REDUCER 12-8 DVNC XI (CANNULA) ×3 IMPLANT
CATH INTERMIT  6FR 70CM (CATHETERS) ×15 IMPLANT
CELLS DAT CNTRL 66122 CELL SVR (MISCELLANEOUS) IMPLANT
CHLORAPREP W/TINT 26 (MISCELLANEOUS) ×5 IMPLANT
CLIP APPLIE 5 13 M/L LIGAMAX5 (MISCELLANEOUS) IMPLANT
CLIP APPLIE ROT 10 11.4 M/L (STAPLE) IMPLANT
CLIP VESOLOCK LG 6/CT PURPLE (CLIP) IMPLANT
CLIP VESOLOCK MED LG 6/CT (CLIP) IMPLANT
CLOTH BEACON ORANGE TIMEOUT ST (SAFETY) ×5 IMPLANT
COVER SURGICAL LIGHT HANDLE (MISCELLANEOUS) ×10 IMPLANT
COVER TIP SHEARS 8 DVNC (MISCELLANEOUS) ×3 IMPLANT
COVER TIP SHEARS 8MM DA VINCI (MISCELLANEOUS) ×2
COVER WAND RF STERILE (DRAPES) IMPLANT
DECANTER SPIKE VIAL GLASS SM (MISCELLANEOUS) ×5 IMPLANT
DERMABOND ADVANCED (GAUZE/BANDAGES/DRESSINGS) ×2
DERMABOND ADVANCED .7 DNX12 (GAUZE/BANDAGES/DRESSINGS) ×3 IMPLANT
DEVICE TROCAR PUNCTURE CLOSURE (ENDOMECHANICALS) IMPLANT
DRAIN CHANNEL 19F RND (DRAIN) IMPLANT
DRAPE ARM DVNC X/XI (DISPOSABLE) ×12 IMPLANT
DRAPE COLUMN DVNC XI (DISPOSABLE) ×3 IMPLANT
DRAPE DA VINCI XI ARM (DISPOSABLE) ×8
DRAPE DA VINCI XI COLUMN (DISPOSABLE) ×2
DRAPE SURG IRRIG POUCH 19X23 (DRAPES) ×5 IMPLANT
DRSG OPSITE POSTOP 4X10 (GAUZE/BANDAGES/DRESSINGS) IMPLANT
DRSG OPSITE POSTOP 4X6 (GAUZE/BANDAGES/DRESSINGS) ×5 IMPLANT
DRSG OPSITE POSTOP 4X8 (GAUZE/BANDAGES/DRESSINGS) IMPLANT
DRSG TEGADERM 2-3/8X2-3/4 SM (GAUZE/BANDAGES/DRESSINGS) ×25 IMPLANT
DRSG TEGADERM 4X4.75 (GAUZE/BANDAGES/DRESSINGS) ×5 IMPLANT
ELECT PENCIL ROCKER SW 15FT (MISCELLANEOUS) ×5 IMPLANT
ELECT REM PT RETURN 15FT ADLT (MISCELLANEOUS) ×5 IMPLANT
ENDOLOOP SUT PDS II  0 18 (SUTURE)
ENDOLOOP SUT PDS II 0 18 (SUTURE) IMPLANT
EVACUATOR SILICONE 100CC (DRAIN) IMPLANT
GAUZE SPONGE 2X2 8PLY STRL LF (GAUZE/BANDAGES/DRESSINGS) ×3 IMPLANT
GAUZE SPONGE 4X4 12PLY STRL (GAUZE/BANDAGES/DRESSINGS) IMPLANT
GLOVE BIO SURGEON STRL SZ7.5 (GLOVE) ×15 IMPLANT
GLOVE BIOGEL M STRL SZ7.5 (GLOVE) ×5 IMPLANT
GLOVE INDICATOR 8.0 STRL GRN (GLOVE) ×15 IMPLANT
GOWN STRL REUS W/TWL LRG LVL3 (GOWN DISPOSABLE) ×10 IMPLANT
GOWN STRL REUS W/TWL XL LVL3 (GOWN DISPOSABLE) ×25 IMPLANT
GRASPER SUT TROCAR 14GX15 (MISCELLANEOUS) IMPLANT
GUIDEWIRE STR DUAL SENSOR (WIRE) ×5 IMPLANT
HOLDER FOLEY CATH W/STRAP (MISCELLANEOUS) ×10 IMPLANT
KIT PROCEDURE DA VINCI SI (MISCELLANEOUS) ×2
KIT PROCEDURE DVNC SI (MISCELLANEOUS) ×3 IMPLANT
KIT TURNOVER KIT A (KITS) IMPLANT
MANIFOLD NEPTUNE II (INSTRUMENTS) ×5 IMPLANT
NEEDLE INSUFFLATION 14GA 120MM (NEEDLE) ×5 IMPLANT
PACK CARDIOVASCULAR III (CUSTOM PROCEDURE TRAY) ×5 IMPLANT
PACK COLON (CUSTOM PROCEDURE TRAY) ×5 IMPLANT
PACK CYSTO (CUSTOM PROCEDURE TRAY) ×5 IMPLANT
PAD POSITIONING PINK XL (MISCELLANEOUS) ×5 IMPLANT
PORT LAP GEL ALEXIS MED 5-9CM (MISCELLANEOUS) ×5 IMPLANT
PROTECTOR NERVE ULNAR (MISCELLANEOUS) ×10 IMPLANT
RTRCTR WOUND ALEXIS 18CM MED (MISCELLANEOUS)
SCISSORS LAP 5X35 DISP (ENDOMECHANICALS) IMPLANT
SEAL CANN UNIV 5-8 DVNC XI (MISCELLANEOUS) ×12 IMPLANT
SEAL XI 5MM-8MM UNIVERSAL (MISCELLANEOUS) ×8
SEALER VESSEL DA VINCI XI (MISCELLANEOUS) ×2
SEALER VESSEL EXT DVNC XI (MISCELLANEOUS) ×3 IMPLANT
SET IRRIG TUBING LAPAROSCOPIC (IRRIGATION / IRRIGATOR) ×5 IMPLANT
SLEEVE ADV FIXATION 5X100MM (TROCAR) IMPLANT
SLEEVE SURGEON STRL (DRAPES) ×5 IMPLANT
SOLUTION ELECTROLUBE (MISCELLANEOUS) ×5 IMPLANT
SPONGE GAUZE 2X2 STER 10/PKG (GAUZE/BANDAGES/DRESSINGS) ×2
STAPLER 45 BLU RELOAD XI (STAPLE) IMPLANT
STAPLER 45 BLUE RELOAD XI (STAPLE)
STAPLER 45 GREEN RELOAD XI (STAPLE) ×4
STAPLER 45 GRN RELOAD XI (STAPLE) ×6 IMPLANT
STAPLER CANNULA SEAL DVNC XI (STAPLE) ×3 IMPLANT
STAPLER CANNULA SEAL XI (STAPLE) ×2
STAPLER ECHELON POWER CIR 29 (STAPLE) ×5 IMPLANT
STAPLER SHEATH (SHEATH) ×2
STAPLER SHEATH ENDOWRIST DVNC (SHEATH) ×3 IMPLANT
SURGILUBE 2OZ TUBE FLIPTOP (MISCELLANEOUS) ×5 IMPLANT
SUT MNCRL AB 4-0 PS2 18 (SUTURE) ×10 IMPLANT
SUT PDS AB 1 CT1 27 (SUTURE) ×10 IMPLANT
SUT PDS AB 1 TP1 96 (SUTURE) IMPLANT
SUT PROLENE 0 CT 2 (SUTURE) IMPLANT
SUT PROLENE 2 0 KS (SUTURE) ×5 IMPLANT
SUT PROLENE 2 0 SH DA (SUTURE) IMPLANT
SUT SILK 2 0 (SUTURE)
SUT SILK 2 0 SH CR/8 (SUTURE) IMPLANT
SUT SILK 2-0 18XBRD TIE 12 (SUTURE) IMPLANT
SUT SILK 3 0 (SUTURE) ×2
SUT SILK 3 0 SH CR/8 (SUTURE) ×5 IMPLANT
SUT SILK 3-0 18XBRD TIE 12 (SUTURE) ×3 IMPLANT
SUT V-LOC BARB 180 2/0GR6 GS22 (SUTURE)
SUT VIC AB 3-0 SH 18 (SUTURE) IMPLANT
SUT VIC AB 3-0 SH 27 (SUTURE)
SUT VIC AB 3-0 SH 27XBRD (SUTURE) IMPLANT
SUT VICRYL 0 UR6 27IN ABS (SUTURE) ×10 IMPLANT
SUTURE V-LC BRB 180 2/0GR6GS22 (SUTURE) IMPLANT
SYR 10ML LL (SYRINGE) ×5 IMPLANT
SYS LAPSCP GELPORT 120MM (MISCELLANEOUS)
SYSTEM LAPSCP GELPORT 120MM (MISCELLANEOUS) IMPLANT
TAPE UMBILICAL COTTON 1/8X30 (MISCELLANEOUS) ×5 IMPLANT
TOWEL OR NON WOVEN STRL DISP B (DISPOSABLE) ×5 IMPLANT
TRAY FOLEY MTR SLVR 16FR STAT (SET/KITS/TRAYS/PACK) ×5 IMPLANT
TROCAR ADV FIXATION 5X100MM (TROCAR) ×5 IMPLANT
TUBING CONNECTING 10 (TUBING) ×20 IMPLANT
TUBING CONNECTING 10' (TUBING) ×5
TUBING ENDO SMARTCAP CO2 EXT (MISCELLANEOUS) ×5 IMPLANT
TUBING INSUFFLATION 10FT LAP (TUBING) ×5 IMPLANT
TUBING UROLOGY SET (TUBING) IMPLANT

## 2019-01-10 NOTE — Op Note (Signed)
Preoperative diagnosis:  1. Diverticulitis   Postoperative diagnosis:  1. Diverticulitis   Procedure:  1. Cystoscopy 2. Bilateral ureteral stent placement (6 Fr) 3. Injection of indocyanine green dye  Surgeon: Pryor Curia. M.D.  Anesthesia: General  Complications: None  EBL: Minimal  Specimens: None  Indication: Kristie Allen is a 63 y.o. patient with diverticulitis.  It was requested by Dr. Dema Severin to have ureteral stents placed preoperatively for intraoperative identification.  After reviewing the management options for treatment, she elected to proceed with the above surgical procedure(s). We have discussed the potential benefits and risks of the procedure, side effects of the proposed treatment, the likelihood of the patient achieving the goals of the procedure, and any potential problems that might occur during the procedure or recuperation. Informed consent has been obtained.  Description of procedure:  The patient was taken to the operating room and general anesthesia was induced.  The patient was placed in the dorsal lithotomy position, prepped and draped in the usual sterile fashion, and preoperative antibiotics were administered. A preoperative time-out was performed.   Cystourethroscopy was performed.  The patient's urethra was examined and was normal. The bladder was then systematically examined in its entirety. There was no evidence for any bladder tumors, stones, or other mucosal pathology.    Attention then turned to the left ureteral orifice and a ureteral catheter was used to intubate the ureteral orifice.  7.5 ml of indocyanine green dye was injected through the ureteral catheter.  A 0.38 sensor guidewire was then advanced up the left ureter into the renal pelvis under fluoroscopic guidance.  The open ended stent was then advanced through the cystoscope over the wire using Seldinger technique.  The stent was positioned appropriately under fluoroscopic and  cystoscopic guidance.  The wire was then removed.  Attention then turned to the right ureteral orifice and a ureteral catheter was used to intubate the ureteral orifice.  7.5 ml of indocyanine green dye was injected through the ureteral catheter.  A 0.38 sensor guidewire was then advanced up the right ureter into the renal pelvis under fluoroscopic guidance.  The open ended stent was then advanced through the cystoscope over the wire using Seldinger technique.  The stent was positioned appropriately under fluoroscopic and cystoscopic guidance.  The wire was then removed.  A Foley catheter was then placed and the ureteral stents were tied to the catheter and connected via a Goldberg connector. The patient appeared to tolerate the procedure well and without complications.  The case was then turned over to Dr. Dema Severin for the remainder of the procedure.   Pryor Curia MD

## 2019-01-10 NOTE — Anesthesia Postprocedure Evaluation (Signed)
Anesthesia Post Note  Patient: Kristie Allen  Procedure(s) Performed: XI ROBOT ASSISTED SIGMOIDECTOMY, WITH ICG (N/A Abdomen) FLEXIBLE SIGMOIDOSCOPY (N/A ) CYSTOSCOPY WITH URETERAL CATHETER PLACEMENT AND FIREFLY INJECTION (Bilateral Ureter)     Patient location during evaluation: PACU Anesthesia Type: General Level of consciousness: awake and alert Pain management: pain level controlled Vital Signs Assessment: post-procedure vital signs reviewed and stable Respiratory status: spontaneous breathing, nonlabored ventilation, respiratory function stable and patient connected to nasal cannula oxygen Cardiovascular status: blood pressure returned to baseline and stable Postop Assessment: no apparent nausea or vomiting Anesthetic complications: no    Last Vitals:  Vitals:   01/10/19 1630 01/10/19 1650  BP: (!) 145/80 (!) 149/73  Pulse: 65 63  Resp: (!) 21 16  Temp: 36.7 C 36.8 C  SpO2: 94% 98%    Last Pain:  Vitals:   01/10/19 1650  TempSrc: Oral  PainSc:                  [place]et [place]

## 2019-01-10 NOTE — Anesthesia Procedure Notes (Signed)
Procedure Name: Intubation Date/Time: 01/10/2019 11:27 AM Performed by: Talbot Grumbling, CRNA Pre-anesthesia Checklist: Patient identified, Emergency Drugs available, Suction available and Patient being monitored Patient Re-evaluated:Patient Re-evaluated prior to induction Oxygen Delivery Method: Circle system utilized Preoxygenation: Pre-oxygenation with 100% oxygen Induction Type: IV induction Ventilation: Mask ventilation without difficulty Laryngoscope Size: Mac and 3 Grade View: Grade II Tube type: Oral Tube size: 7.5 mm Number of attempts: 2 Airway Equipment and Method: Stylet Placement Confirmation: ETT inserted through vocal cords under direct vision,  positive ETCO2 and breath sounds checked- equal and bilateral Secured at: 22 cm Tube secured with: Tape Dental Injury: Teeth and Oropharynx as per pre-operative assessment

## 2019-01-10 NOTE — Transfer of Care (Signed)
Immediate Anesthesia Transfer of Care Note  Patient: Kristie Allen  Procedure(s) Performed: XI ROBOT ASSISTED SIGMOIDECTOMY, WITH ICG (N/A Abdomen) FLEXIBLE SIGMOIDOSCOPY (N/A ) CYSTOSCOPY WITH URETERAL CATHETER PLACEMENT AND FIREFLY INJECTION (Bilateral Ureter)  Patient Location: PACU  Anesthesia Type:General  Level of Consciousness: sedated  Airway & Oxygen Therapy: Patient Spontanous Breathing and Patient connected to face mask oxygen  Post-op Assessment: Report given to RN and Post -op Vital signs reviewed and stable  Post vital signs: Reviewed and stable  Last Vitals:  Vitals Value Taken Time  BP 140/72 01/10/19 1541  Temp    Pulse 75 01/10/19 1542  Resp 15 01/10/19 1542  SpO2 94 % 01/10/19 1542  Vitals shown include unvalidated device data.  Last Pain:  Vitals:   01/10/19 0922  TempSrc:   PainSc: 0-No pain         Complications: No apparent anesthesia complications

## 2019-01-10 NOTE — Op Note (Signed)
PATIENT: Kristie Allen  63 y.o. female  Patient Care Team: Spry, Marsh Dolly., MD as PCP - General (Family Medicine)  PREOP DIAGNOSIS: COMPLICATED DIVERTICULITIS  POSTOP DIAGNOSIS: COMPLICATED DIVERTICULITIS  PROCEDURE:  1. Robot assisted low anterior resection 2. Intraoperative assessment of perfusion 3. Flexible sigmoidoscopy (4. Cystoscopy/ureteral stents - Dr. Alinda Money)  SURGEON: Sharon Mt. Sarena Jezek, MD  ASSISTANT: Michael Boston, MD  ANESTHESIA: General endotracheal  EBL: 100 mL Total I/O In: 1400 [I.V.:1400] Out: 400 [Urine:300; Blood:100]  DRAINS: None  SPECIMEN: Rectosigmoid colon - open end is proximal, staple line is distal  COUNTS: Sponge, needle and instrument counts were reported correct x2  FINDINGS:  Significant perisigmoidal abscess overlying the retroperitoneum on the left side.  This contained no pus but there was a significant amount of scarring and inflammation in this region. This was all removed. Proximal rectum was the distal point of transection. The anastomosis is located 15 cm from the anal verge by flexible sigmoidoscopy.  STATEMENT OF MEDICAL NECESSITY: Ms. Woelk is a very pleasant 84yoF with hx of HTN, HLD, DM, hypothyroidism is admitted to [facility] 08/01/2018 with her first attack of left lower quadrant pain area just evaluated and found to have diverticulitis. She is admitted for 2 days to the medicine service and improved and was discharged on Augmentin. She returned to the hospital 2 days later with recurrent/worsening left lower quadrant pain and fevers. She is reevaluated and a CT scan demonstrated a perisigmoidal abscess posterior to her sigmoid colon. She was admitted, placed antibiotics and underwent percutaneous drainage by IR on 08/12/2018. She had improvement following all of this. She has some light brown-colored output. She improved and was discharged home 08/15/2018. She underwent drain study 08/29/2018 which demonstrated a thin fistulous  connection to the sigmoid colon.   She underwent colonoscopy 11/05/2018 with Dr. Fuller Plan - 6 diminutive polyps were removed. She also had diverticula noted in the sigmoid colon with spasm and some mild peridiverticular erythema. Internal hemorrhoids. She underwent repeat drain study 11/07/2018 which demonstrated resolution of the fistulous tract and interventional radiology removed her drain. She returned to the office for f/u and further discussion and noted to be doing reasonably well - still with occasional gas like pains but nothing like what she had before  She had been seen and evaluated and options reviewed. She opted to pursue surgery for her sigmoid diverticulitis. Please refer to notes elsewhere for details regarding these discussions.  NARRATIVE: Informed consent was verified. The patient was taken to the operating room, placed supine on the operating table and SCD's were applied. General endotracheal anesthesia was induced. The patient was then positioned in the lithotomy position with Allen stirrups. Pressure points were then evaluated and padded.  Dr. Alinda Money then scrubbed for his portion of the procedure. The patient was prepped and draped for placement of ureteral stents and cystoscopy.  Antibiotics had been administered.  Cystoscopy/stents was then performed - please refer to Dr. Alinda Money.  The patient's abdomen was then prepped and draped in the standard sterile fashion. Surgical timeout confirmed our plan.   An OG tube was in place and to suction.  The Veress needle was inserted at Palmer's point and intraperitoneal location confirmed using the aspiration and saline drop test.  Pneumoperitoneum was established with CO2 to a maximum pressure of 15 mmHg.  Planned trocar sites were then marked in a line extending from the left upper quadrant down to the right ASIS.  Just to the right umbilicus, a blunt 8 mm robotic trocar  was utilized.  The laparoscope was inserted and demonstrated no  evidence of trocar site or Veress needle site complications.  The Veress needle was removed.  3 additional 8 mm trochars were then placed at the premarked points.  A 5 mm assist port was placed in the right lateral abdomen.  A 12 mm trocar was placed 2 fingerbreadths above the pubic symphysis well above the bladder.  Bilateral transversus abdominis plane blocks were then created using dilute mixture of Marcaine and Exparel.   The patient was positioned in Trendelenburg with left side up. The omentum and small bowel was reflected cephalad.  The robot was then docked. I went to the console.  A significant disease sigmoid colon was noted in the left lateral abdomen with omentum overlying it.  This was mobilized off the sigmoid colon using the vessel sealer.  The colon is dense adherent to the left abdominal wall.  This was carefully swept down.  Because of the location of her prior perforation being in the retroperitoneum, a lateral to medial approach was selected.  The sigmoid colon was mobilized medially.  As we approached the left pelvic sidewall, care was taken to avoid injuring any retroperitoneal structures.  With firefly guidance, the left ureter was clearly identified and protected free of any injury.  The colon was reflected medially.  Following this, the sigmoid and descending colon were mobilized up to the level of the splenic flexure.  The mesocolon was reflected off of underlying Gerota's.  Some of the omentum was also taken off of the descending colon where it was adherent to appendices epiploica.  Portions of this procedure were completed with her in reverse Trendelenburg.  Following this she was returned to Trendelenburg.  There additionally inflammatory attachments to the left tube and ovary.  These were able to be separated.  The rectosigmoid colon was then grasped and elevated anteriorly.  The space between the presacral fascia and fascia propria the rectum was identified and incised.  The  avascular plane was developed proximally distally.  Distally, the proximal mesorectum was mobilized.  Working proximally, the peritoneum overlying the IMA pedicle was incised.  The IMA was then circumferentially dissected.  The location of the left ureter was again confirmed to be "down."  After confirming this, the IMA was then divided with the vessel sealer.  The pedicle was then inspected and noted to be completely hemostatic.  The IMV was also identified just cephalad to this.  The peritoneum was dissected free of it.  The vessel was circumferentially dissected and again left ureter confirmed to be away from our plane dissection.  This was then divided using the vessel sealer.  The pedicle was inspected and noted to be hemostatic.  The location of the distal transection was identified on the proximal rectum and location of the tinea had splayed, there were no appendices epiploica, an area that was overlying the sacral promontory and anatomically appeared to be the proximal portion of the rectum.  The mesorectum at this location was then cleared with the vessel sealer.  The proximal rectum had been circumferentially dissected.  Through the 12 mm trocar, robotic 45 mm green load stapler was passed.  With 2 firings of this, the rectum was completely divided.  The stump was hemostatic.  The stump appeared pink.  The planned proximal point of transection was identified on supple, soft: Proximal to her area of disease.  The mesentery was cleared out to this level using the vessel sealer.  Following this,  attention was turned to performing a perfusion test.  ICG was administered and demonstrated excellent perfusion of the distal descending colon out to the level of our divided mesentery.  The rectal stump was inspected and noted to have excellent perfusion as well.  Both the IMA and IMV were reinspected and noted to be hemostatic.  The pelvis was irrigated and hemostasis verified.  Attention was then turned to the  extracorporeal portion of the procedure.  The robot was then undocked.   A Pfannenstiel incision was made approximately 2 finger breadths above the pubic symphysis and carried down to the anterior rectus fascia.  The anterior rectus fascia was then opened transversely.  Kocher clamps were placed on the fascia and the fibroconnective attachments to the anterior rectus fascia were taken down with electrocautery.  Hemostasis was verified.  The midline peritoneum was then incised and the peritoneal cavity entered.  Care was taken to protect the bladder from injury.  An [place] wound protector was then placed.  The sigmoid colon was delivered. A pursestring device was placed and a 2-0 prolene passed. The colon was divided and passed off. Additional 3-0 silk "beltloops" were placed around the pursestring. EEA sizers were passed and the 29 mm EEA selected. The anvil was placed and pursestring tied. Some fat was then carefully cleared. This was pink and well perfused in appearance. This was placed back in the abdomen and cap placed and pneumoperitoneum re-established.   I then went below. A 29 mm EEA sizer was carefully passed up the rectum under direct visualization.  The EEA stapler was then passed and the spike deployed just anterior to the staple line.  The components were mated and orientation of the proximal limb of colon confirmed there was no twisting of the colon nor mesentery. The two ends reached one another without any tension. No bowel was present under the mesenteric defect. Care was taken to ensure no other structures were included in the staple line. The anastomosis was then created.  We then turned our attention to performing a leak test.  The colon proximal to the anastomosis was occluded and a flexible sigmoidoscope passed. The pelvis was filled with sterile saline. Air insufflation demonstrated no bubbles or air leak present.  Both ends of the bowel at the level of the anastomosis were pink.  Anastomosis rests 15 cm from anal verge.  The irrigation was evacuated.  Hemostasis was verified.  The Alexis wound protector and ports were all removed.  Attention was then turned to closing the abdomen.  The abdomen was redraped with sterile drapes.  All equipment was exchanged for clean equipment.  Gloves and gowns of all scrubbed participants were changed.  The wounds were then irrigated with sterile saline.  The peritoneum was closed with a running 0 Vicryl suture.  The rectus fascia was then approximated with running #1 PDS suture.  The skin of all incision sites was closed with 4-0 Monocryl subcuticular sutures.  Dermabond was applied to all incisions and a sterile dressing placed over the Pfannenstiel incision.  The ureteral stents at the end of the case were then removed and the Foley catheter left in place.    An MD assistant was necessary for tissue manipulation, retraction and positioning due to the complexity of the case, obesity of the patient and hospital policies  DISPOSITION: PACU in satisfactory condition

## 2019-01-10 NOTE — Progress Notes (Signed)
Pt refused cpap qhs.  Pt states that she has not been wearing her cpap very much lately at home and does not wish to wear it while in the hospital. Pt encouraged to contact RT should she change her mind.

## 2019-01-10 NOTE — H&P (Signed)
CC: Here today for surgery  HPI: Kristie Allen is a very pleasant 75yoF with hx of HTN, HLD, DM, hypothyroidism is admitted to [facility] 08/01/2018 with her first attack of left lower quadrant pain area just evaluated and found to have diverticulitis. She is admitted for 2 days to the medicine service and improved and was discharged on Augmentin. She returned to the hospital 2 days later with recurrent/worsening left lower quadrant pain and fevers. She is reevaluated and a CT scan demonstrated a perisigmoidal abscess posterior to her sigmoid colon. She was admitted, placed antibiotics and underwent percutaneous drainage by IR on 08/12/2018. She had improvement following all of this. She has some light brown-colored output. She improved and was discharged home 08/15/2018. She underwent drain study 08/29/2018 which demonstrated a thin fistulous connection to the sigmoid colon.   She underwent colonoscopy 11/05/2018 with Dr. Fuller Plan - 6 diminutive polyps were removed. She also had diverticula noted in the sigmoid colon with spasm and some mild peridiverticular erythema. Internal hemorrhoids. She underwent repeat drain study 11/07/2018 which demonstrated resolution of the fistulous tract and interventional radiology removed her drain. She returned to the office for f/u and further discussion and noted to be doing reasonably well - still with occasional gas like pains but nothing like what she had before  She reports no changes in her health, history or medications since we met in office. She reports she has done well without any further "attacks" of LLQ/fever/chills/n/v.  PMH: HTN (well controlled on oral antihypertensive), HLD (well controlled on statin), DM (well-controlled on oral hypoglycemics); hypothyroidism (well-controlled on Synthroid)  PSH: She denies any prior abdominal surgical history including BTL or c-sx  FHx: Denies FHx of malignancy  Social: Denies use of tobacco/drugs; quit smoking  in 1984. Rare social EtOH use. 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  ROS: A comprehensive 10 system review of systems was completed with the patient and pertinent findings as noted above.  Past Medical History:  Diagnosis Date  . Diabetes mellitus without complication ([place])    Type II  . Diverticulitis 07/2018  . GERD (gastroesophageal reflux disease)   . Hypertension   . Hypothyroidism   . Sleep apnea     Past Surgical History:  Procedure Laterality Date  . IR RADIOLOGIST EVAL & MGMT  08/29/2018  . IR RADIOLOGIST EVAL & MGMT  09/26/2018  . IR RADIOLOGIST EVAL & MGMT  10/24/2018  . IR RADIOLOGIST EVAL & MGMT  11/07/2018  . VARICOSE VEIN SURGERY      Family History  Problem Relation Age of Onset  . Anuerysm Mother   . Diabetes Mother   . Hypertension Mother   . Aneurysm Sister   . Diabetes Sister   . Aneurysm Brother   . Diabetes Brother   . Hypertension Brother   . Diabetes Father   . Heart attack Father   . Hypertension Father   . Colon cancer Neg Hx   . Esophageal cancer Neg Hx   . Rectal cancer Neg Hx   . Stomach cancer Neg Hx     Social:  reports that she has quit smoking. She has never used smokeless tobacco. She reports current alcohol use of about 4.0 standard drinks of alcohol per week. She reports that she does not use drugs.  Allergies:  Allergies  Allergen Reactions  . Bisphosphonates Other (See Comments)    Flu symptoms    Medications: I have reviewed the patient's current medications.  Results for orders placed  or performed during the hospital encounter of 01/10/19 (from the past 48 hour(s))  Glucose, capillary     Status: Abnormal   Collection Time: 01/10/19  9:11 AM  Result Value Ref Range   Glucose-Capillary 150 (H) 70 - 99 mg/dL    No results found.  ROS - all of the below systems have been reviewed with the patient and positives are indicated with bold text General: chills, fever or night sweats Eyes: blurry vision or double  vision ENT: epistaxis or sore throat Allergy/Immunology: itchy/watery eyes or nasal congestion Hematologic/Lymphatic: bleeding problems, blood clots or swollen lymph nodes Endocrine: temperature intolerance or unexpected weight changes Breast: new or changing breast lumps or nipple discharge Resp: cough, shortness of breath, or wheezing CV: chest pain or dyspnea on exertion GI: as per HPI GU: dysuria, trouble voiding, or hematuria MSK: joint pain or joint stiffness Neuro: TIA or stroke symptoms Derm: pruritus and skin lesion changes Psych: anxiety and depression  PE Blood pressure 136/76, pulse 74, temperature 98.1 F (36.7 C), temperature source Oral, resp. rate 18, height _0  (1.626 m), weight 113.2 kg, SpO2 100 %. Constitutional: NAD; conversant; no deformities; wearing surgical mask Eyes: Moist conjunctiva; no lid lag; anicteric; PERRL Neck: Trachea midline; no thyromegaly Lungs: Normal respiratory effort; no tactile fremitus CV: RRR; no palpable thrills; no pitting edema GI: Abd obese, soft, NT/ND; no palpable hepatosplenomegaly MSK: Normal gait; no clubbing/cyanosis Psychiatric: Appropriate affect; alert and oriented x3 Lymphatic: No palpable cervical or axillary lymphadenopathy  Results for orders placed or performed during the hospital encounter of 01/10/19 (from the past 48 hour(s))  Glucose, capillary     Status: Abnormal   Collection Time: 01/10/19  9:11 AM  Result Value Ref Range   Glucose-Capillary 150 (H) 70 - 99 mg/dL   A/P: Kristie Allen is a very pleasant 60yoF with hx of HTN, HLD, DM, hypothyroidism here today for follow-up of complicated diverticulitis managed with percutaneous drain placement. Drain fistula resolved radiographically and IR removed this 7/22. Her abscess appeared to be posterior to the colon abutting the retroperitoneum Colonoscopy 11/05/2018 - diminutive polyps removed - 6 in total  -The anatomy and physiology of the GI tract was discussed at  length with the patient. The pathophysiology of diverticulitis was discussed at length as well. -We re-discussed options going forward. We discussed with surgery, recurrence rates of diverticulitis in the setting of prior perforation with abscess necessitating drain placement is high and the rationale for surgery would be to reduce the risk of this kind of recurrence. She experienced quite a bit of stress related to this and does not want to go through this again. -We discussed minimally invasive (robot assisted) and potentially open techniques for sigmoid colectomy with flexible sigmoidoscopy to check anastomosis at conclusion as well. We discussed urology involvement for cystoscopy/ureteral stents as well -The planned procedures, material risks (including, but not limited to, pain, bleeding, infection, scarring, need for blood transfusion, damage to surrounding structures- blood vessels/nerves/viscus/organs, damage to ureter, urine leak, leak from anastomosis, need for additional procedures, need for stoma which may be permanent, contraction of COVID-19, hernia, recurrence, pneumonia, heart attack, stroke, death) benefits and alternatives to surgery were discussed at length. I noted a good probability that the procedure would help through risk reduction of recurrence. The patient's questions were answered to her satisfaction, she voiced understanding and wishes to proceed with surgery. Additionally, we discussed typical postoperative expectations and the recovery process.  Sharon Mt. Dema Severin, M.D. [facility] [phone]

## 2019-01-11 ENCOUNTER — Encounter (HOSPITAL_COMMUNITY): Payer: Self-pay | Admitting: Surgery

## 2019-01-11 LAB — BASIC METABOLIC PANEL
Anion gap: 9 (ref 5–15)
BUN: 14 mg/dL (ref 8–23)
CO2: 23 mmol/L (ref 22–32)
Calcium: 8.6 mg/dL — ABNORMAL LOW (ref 8.9–10.3)
Chloride: 106 mmol/L (ref 98–111)
Creatinine, Ser: 1.01 mg/dL — ABNORMAL HIGH (ref 0.44–1.00)
GFR calc Af Amer: >60 mL/min (ref >60)
GFR calc non Af Amer: 60 mL/min — ABNORMAL LOW (ref >60)
Glucose, Bld: 147 mg/dL — ABNORMAL HIGH (ref 70–99)
Potassium: 4.7 mmol/L (ref 3.5–5.1)
Sodium: 138 mmol/L (ref 135–145)

## 2019-01-11 LAB — GLUCOSE, CAPILLARY
Glucose-Capillary: 121 mg/dL — ABNORMAL HIGH (ref 70–99)
Glucose-Capillary: 138 mg/dL — ABNORMAL HIGH (ref 70–99)
Glucose-Capillary: 99 mg/dL (ref 70–99)
Glucose-Capillary: 101 mg/dL — ABNORMAL HIGH (ref 70–99)

## 2019-01-11 LAB — CBC
HCT: 39.9 % (ref 36.0–46.0)
Hemoglobin: 12.2 g/dL (ref 12.0–15.0)
MCH: 26.3 pg (ref 26.0–34.0)
MCHC: 30.6 g/dL (ref 30.0–36.0)
MCV: 86 fL (ref 80.0–100.0)
Platelets: 302 10*3/uL (ref 150–400)
RBC: 4.64 MIL/uL (ref 3.87–5.11)
RDW: 13.8 % (ref 11.5–15.5)
WBC: 15.1 10*3/uL — ABNORMAL HIGH (ref 4.0–10.5)
nRBC: 0 % (ref 0.0–0.2)

## 2019-01-11 MED ORDER — METFORMIN HCL ER 500 MG PO TB24
1000.0000 mg | ORAL_TABLET | Freq: Every day | ORAL | Status: DC
  Administered 2019-01-12 – 2019-01-13 (×2): 1000 mg via ORAL
  Filled 2019-01-11 (×2): qty 2

## 2019-01-11 MED ORDER — EZETIMIBE 10 MG PO TABS
10.0000 mg | ORAL_TABLET | Freq: Every day | ORAL | Status: DC
  Administered 2019-01-12 – 2019-01-13 (×2): 10 mg via ORAL
  Filled 2019-01-11 (×2): qty 1

## 2019-01-11 MED ORDER — EMPAGLIFLOZIN-METFORMIN HCL ER 25-1000 MG PO TB24: 1.0000 | ORAL_TABLET | Freq: Every day | ORAL | Status: DC

## 2019-01-11 MED ORDER — CHLORHEXIDINE GLUCONATE CLOTH 2 % EX PADS
6.0000 | MEDICATED_PAD | Freq: Every day | CUTANEOUS | Status: DC
  Administered 2019-01-11: 10:00:00 6 via TOPICAL

## 2019-01-11 MED ORDER — CANAGLIFLOZIN 100 MG PO TABS
100.0000 mg | ORAL_TABLET | Freq: Every day | ORAL | Status: DC
  Administered 2019-01-12 – 2019-01-13 (×2): 100 mg via ORAL
  Filled 2019-01-11 (×3): qty 1

## 2019-01-11 MED ORDER — CALCIUM CARBONATE-VITAMIN D 500-200 MG-UNIT PO TABS
1.0000 | ORAL_TABLET | Freq: Every day | ORAL | Status: DC
  Administered 2019-01-12 – 2019-01-13 (×2): 1 via ORAL
  Filled 2019-01-11 (×2): qty 1

## 2019-01-11 MED ORDER — TRAMADOL HCL 50 MG PO TABS: 50.0000 mg | ORAL_TABLET | Freq: Four times a day (QID) | ORAL | 0 refills | Status: AC | PRN

## 2019-01-11 MED ORDER — CALCIUM CARB-CHOLECALCIFEROL 600-800 MG-UNIT PO TABS: ORAL_TABLET | Freq: Every day | ORAL | Status: DC

## 2019-01-11 NOTE — Evaluation (Signed)
Occupational Therapy Evaluation and Discharge Patient Details Name: Kristie Allen MRN: [id number] DOB: Dec 19, 1955 Today's Date: 01/11/2019    History of Present Illness Pt is s/p stent placement and sigmoidectomy (01/10/19). PMH: HTN, HLD, DM, hypothyroidism   Clinical Impression   [name] admitted and underwent above presents to acute OT at an overall S level and will have this at home. No further OT needs, we will sign off.    Follow Up Recommendations  No OT follow up;Supervision - Intermittent    Equipment Recommendations  None recommended by OT       Precautions / Restrictions Precautions Precautions: None Restrictions Weight Bearing Restrictions: No      Mobility Bed Mobility               General bed mobility comments: Pt in recliner upon arrival  Transfers Overall transfer level: Needs assistance Equipment used: None Transfers: Sit to/from Stand Sit to Stand: Supervision              Balance Overall balance assessment: No apparent balance deficits (not formally assessed)                                         ADL either performed or assessed with clinical judgement   ADL Overall ADL's : Needs assistance/impaired Eating/Feeding: Independent;Sitting   Grooming: Supervision/safety;Standing   Upper Body Bathing: Supervision/ safety;Sitting   Lower Body Bathing: Supervison/ safety;Sit to/from stand   Upper Body Dressing : Supervision/safety;Sitting   Lower Body Dressing: Supervision/safety;Sit to/from stand Lower Body Dressing Details (indicate cue type and reason): lateral sit on side of bed Toilet Transfer: Supervision/safety;Ambulation;RW;Regular Toilet   Toileting- Water quality scientist and Hygiene: Supervision/safety;Sit to/from stand               Vision Patient Visual Report: No change from baseline              Pertinent Vitals/Pain Pain Assessment: 0-10 Pain Score: 5  Pain Location: lower  abdomen Pain Descriptors / Indicators: Discomfort;Sore Pain Intervention(s): Limited activity within patient's tolerance;Monitored during session;Patient requesting pain meds-RN notified     [place] Dominance Right      Communication Communication Communication: No difficulties                 Home Living Family/patient expects to be discharged to:: Private residence Living Arrangements: Children Available Help at Discharge: Available 24 hours/day Type of Home: House Home Access: Stairs to enter Technical brewer of Steps: 5 Entrance Stairs-Rails: Left;Right       Bathroom Shower/Tub: Occupational psychologist: Standard     Home Equipment: Civil engineer, contracting - built in          Prior Functioning/Environment Level of Independence: Independent        Comments: works as a Designer, multimedia Problem List: Decreased strength;Obesity;Pain      OT Treatment/Interventions:      OT Goals(Current goals can be found in the care plan section) Acute Rehab OT Goals Patient Stated Goal: to go home  OT Frequency:                AM-PAC OT "6 Clicks" Daily Activity     Outcome Measure Help from another person eating meals?: None Help from another person taking care of personal grooming?: A Little Help from another person toileting, which includes using  toliet, bedpan, or urinal?: A Little Help from another person bathing (including washing, rinsing, drying)?: A Little Help from another person to put on and taking off regular upper body clothing?: A Little Help from another person to put on and taking off regular lower body clothing?: A Little 6 Click Score: 19   End of Session    Activity Tolerance: Patient tolerated treatment well Patient left: in chair;with call bell/phone within reach  OT Visit Diagnosis: Muscle weakness (generalized) (M62.81);Pain Pain - part of body: (lower abdomen)                Time: [id number] OT Time Calculation (min): 17  min Charges:  OT General Charges $OT Visit: 1 Visit OT Evaluation $OT Eval Moderate Complexity: 1 Mod  Golden Circle, OTR/L Acute [employer] Pager [phone] Office [phone]     Almon Register 01/11/2019, 4:24 PM

## 2019-01-11 NOTE — Discharge Instructions (Signed)
POST OP INSTRUCTIONS AFTER COLON SURGERY  1. DIET: Be sure to include lots of fluids daily to stay hydrated - 64oz of water per day (8, 8 oz glasses).  Avoid fast food or heavy meals for the first couple of weeks as your are more likely to get nauseated. Avoid raw/uncooked fruits or vegetables for the first 4 weeks (its ok to have these if they are blended into smoothie form). If you have fruits/vegetables, make sure they are cooked until soft enough to mash on the roof of your mouth and chew your food well. Otherwise, diet as tolerated.  2. Take your usually prescribed home medications unless otherwise directed.  3. PAIN CONTROL: a. Pain is best controlled by a usual combination of three different methods TOGETHER: i. Ice/Heat ii. Over the counter pain medication iii. Prescription pain medication b. Most patients will experience some swelling and bruising around the surgical site.  Ice packs or heating pads (30-60 minutes up to 6 times a day) will help. Some people prefer to use ice alone, heat alone, alternating between ice & heat.  Experiment to what works for you.  Swelling and bruising can take several weeks to resolve.   c. It is helpful to take an over-the-counter pain medication regularly for the first few weeks: i. Meloxicam (as prescribed by your primary care) ii. Acetaminophen (Tylenol) - you may take 650mg  every 6 hours as needed. You can take this with motrin as they act differently on the body. If you are taking a narcotic pain medication that has acetaminophen in it, do not take over the counter tylenol at the same time. iii. NOTE: You may take both of these medications together - most patients  find it most helpful when alternating between the two (i.e. Ibuprofen at 6am, tylenol at 9am, ibuprofen at 12pm ...) d. A  prescription for pain medication should be given to you upon discharge.  Take your pain medication as prescribed if your pain is not adequatly controlled with the  over-the-counter pain reliefs mentioned above.  4. Avoid getting constipated.  Between the surgery and the pain medications, it is common to experience some constipation.  Increasing fluid intake and taking a fiber supplement (such as Metamucil, Citrucel, FiberCon, MiraLax, etc) 1-2 times a day regularly will usually help prevent this problem from occurring.  A mild laxative (prune juice, Milk of Magnesia, MiraLax, etc) should be taken according to package directions if there are no bowel movements after 48 hours.    5. Dressing: Your incisions are covered in Dermabond which is like sterile superglue for the skin. This will come off on it's own in a couple weeks. It is waterproof and you may bathe normally starting the day after your surgery in a shower. Avoid baths/pools/lakes/oceans until your wounds have fully healed.  6. ACTIVITIES as tolerated:   a. Avoid heavy lifting (>10lbs or 1 gallon of milk) for the next 6 weeks. b. You may resume regular daily activities as tolerated--such as daily self-care, walking, climbing stairs--gradually increasing activities as tolerated.  If you can walk 30 minutes without difficulty, it is safe to try more intense activity such as jogging, treadmill, bicycling, low-impact aerobics.  c. DO NOT PUSH THROUGH PAIN.  Let pain be your guide: If it hurts to do something, don't do it. d. Kristie Allen may drive when you are no longer taking prescription pain medication, you can comfortably wear a seatbelt, and you can safely maneuver your car and apply brakes.  7. FOLLOW UP  in our office a. Please call [facility] at [phone] to set up an appointment to see your surgeon in the office for a follow-up appointment approximately 2 weeks after your surgery. b. Make sure that you call for this appointment the day you arrive home to insure a convenient appointment time.  9. If you have disability or family leave forms that need to be completed, you may have them completed by your primary  care physician's office; for return to work instructions, please ask our office staff and they will be happy to assist you in obtaining this documentation   When to call us [phone]: 1. Poor pain control 2. Reactions / problems with new medications (rash/itching, etc)  3. Fever over 101.5 F (38.5 C) 4. Inability to urinate 5. Nausea/vomiting 6. Worsening swelling or bruising 7. Continued bleeding from incision. 8. Increased pain, redness, or drainage from the incision  The clinic staff is available to answer your questions during regular business hours (8:30am-5pm).  Please dont hesitate to call and ask to speak to one of our nurses for clinical concerns.   A surgeon from [facility] is always on call at the hospitals   If you have a medical emergency, go to the nearest emergency room or call 911.  [facility], [place] [address] [place], [place], [place]  [address] MAIN: [phone] FAX: [phone] [email or web address]

## 2019-01-11 NOTE — Progress Notes (Signed)
Subjective No acute events. Some soreness but feels much better than she expected to feel after surgery. Denies nausea/emesis. Has had couple cups of water without issue. Denies flatus or BM. Has been up once since OR  Objective: Vital signs in last 24 hours: Temp:  [97.6 F (36.4 C)-98.5 F (36.9 C)] 98.5 F (36.9 C) (09/25 0557) Pulse Rate:  [57-78] 57 (09/25 0557) Resp:  [15-21] 15 (09/25 0557) BP: (131-155)/(70-86) 135/71 (09/25 0557) SpO2:  [92 %-100 %] 99 % (09/25 0557) Weight:  HA:[id number] kg] 113.2 kg (09/25 0557)    Intake/Output from previous day: 09/24 0701 - 09/25 0700 In: 2519 [P.O.:200; I.V.:2319] Out: 1425 [Urine:1325; Blood:100] Intake/Output this shift: No intake/output data recorded.  Gen: NAD, comfortable CV: RRR Pulm: Normal work of breathing Abd: Soft, appropriately mildly ttp; incisions c/d/i Ext: SCDs in place  Lab Results: CBC  Recent Labs    01/11/19 0345  WBC 15.1*  HGB 12.2  HCT 39.9  PLT 302   BMET Recent Labs    01/11/19 0345  NA 138  K 4.7  CL 106  CO2 23  GLUCOSE 147*  BUN 14  CREATININE 1.01*  CALCIUM 8.6*   PT/INR No results for input(s): LABPROT, INR in the last 72 hours. ABG No results for input(s): PHART, HCO3 in the last 72 hours.  Invalid input(s): PCO2, PO2  Studies/Results:  Anti-infectives: Anti-infectives (From admission, onward)   Start     Dose/Rate Route Frequency Ordered Stop   01/10/19 1400  neomycin (MYCIFRADIN) tablet 1,000 mg  Status:  Discontinued     1,000 mg Oral 3 times per day 01/10/19 0901 01/10/19 0903   01/10/19 1400  metroNIDAZOLE (FLAGYL) tablet 1,000 mg  Status:  Discontinued     1,000 mg Oral 3 times per day 01/10/19 0901 01/10/19 0903   01/10/19 0915  cefoTEtan (CEFOTAN) 2 g in sodium chloride 0.9 % 100 mL IVPB     2 g 200 mL/hr over 30 Minutes Intravenous On call to O.R. 01/10/19 0901 01/10/19 1543       Assessment/Plan: Patient Active Problem List   Diagnosis Date Noted  . Status  post laparoscopic-assisted sigmoidectomy 01/10/2019  . History of diverticulitis 10/15/2018  . Diverticulitis of large intestine with perforation 08/05/2018  . DM2 (diabetes mellitus, type 2) ([place]) 08/02/2018  . HLD (hyperlipidemia) 08/02/2018  . HTN (hypertension) 08/02/2018  . Diverticulitis 08/02/2018  . Acute diverticulitis 08/01/2018   s/p Procedure(s): XI ROBOT ASSISTED SIGMOIDECTOMY, WITH ICG FLEXIBLE SIGMOIDOSCOPY CYSTOSCOPY WITH URETERAL CATHETER PLACEMENT AND FIREFLY INJECTION 01/10/2019  -Recovering appropriately -Continue Entereg -Diabetic (bariatric) full liquids; advance to carb modified as tolerated -Ambulate 5x/day -MIVF off since tolerating liquids -Home medications ordered -PPx: SQH, SCDs   LOS: 1 day   Delorse Shane M. Dema Severin, M.D. [facility]

## 2019-01-11 NOTE — Evaluation (Signed)
Physical Therapy Evaluation Patient Details Name: Kristie Allen MRN: [id number] DOB: 12-11-1955 Today's Date: 01/11/2019   History of Present Illness  Pt is s/p stent placement and sigmoidectomy (01/10/19). PMH: HTN, HLD, DM, hypothyroidism  Clinical Impression  Pt admitted with above diagnosis.  Pt currently with functional limitations due to the deficits listed below (see PT Problem List). Pt will benefit from skilled PT to increase their independence and safety with mobility to allow discharge to the venue listed below.  Pt moving well overall and ambulated a lap on the unit with the IV pole and min/guard. She was reliant on handrail for bed mobility.  Encouraged to use her incentive spirometer and to ambulate with nursing throughout the day.  Do not anticipate she will need any follow up PT.      Follow Up Recommendations No PT follow up    Equipment Recommendations  None recommended by PT    Recommendations for Other Services       Precautions / Restrictions Precautions Precautions: None Restrictions Weight Bearing Restrictions: No      Mobility  Bed Mobility Overal bed mobility: Needs Assistance Bed Mobility: Rolling;Sidelying to Sit Rolling: Supervision Sidelying to sit: Supervision       General bed mobility comments: S only with cues for technique, but HEAVY reliance on rail  Transfers Overall transfer level: Needs assistance Equipment used: None Transfers: Sit to/from Stand Sit to Stand: Min guard         General transfer comment: min/guard to steady  Ambulation/Gait Ambulation/Gait assistance: Min guard Gait Distance (Feet): 350 Feet Assistive device: IV Pole Gait Pattern/deviations: Step-through pattern Gait velocity: decreased   General Gait Details: 1 standing rest break  Stairs            Wheelchair Mobility    Modified Rankin (Stroke Patients Only)       Balance Overall balance assessment: No apparent balance deficits (not formally  assessed)                                           Pertinent Vitals/Pain Pain Assessment: 0-10 Pain Score: 1  Pain Location: L lower abdomen Pain Descriptors / Indicators: Discomfort Pain Intervention(s): Monitored during session    Home Living Family/patient expects to be discharged to:: Private residence Living Arrangements: Children Available Help at Discharge: Available 24 hours/day Type of Home: House Home Access: Stairs to enter Entrance Stairs-Rails: Chemical engineer of Steps: 5(can go to the back with 3 steps)   Home Equipment: None      Prior Function Level of Independence: Independent         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 Extremity Assessment Upper Extremity Assessment: Overall WFL for tasks assessed    Lower Extremity Assessment Lower Extremity Assessment: Overall WFL for tasks assessed    Cervical / Trunk Assessment Cervical / Trunk Assessment: Normal  Communication   Communication: No difficulties  Cognition Arousal/Alertness: Awake/alert Behavior During Therapy: WFL for tasks assessed/performed Overall Cognitive Status: Within Functional Limits for tasks assessed                                        General Comments General comments (skin integrity, edema,  etc.): Pt instructed in use of pillow for bracing and encouraged use of incentive spirometer    Exercises     Assessment/Plan    PT Assessment Patient needs continued PT services  PT Problem List Decreased mobility;Decreased activity tolerance       PT Treatment Interventions Gait training;Stair training;Functional mobility training;Therapeutic activities;Therapeutic exercise    PT Goals (Current goals can be found in the Care Plan section)  Acute Rehab PT Goals Patient Stated Goal: home PT Goal Formulation: With patient Time For Goal Achievement:  01/25/19 Potential to Achieve Goals: Good    Frequency Min 3X/week   Barriers to discharge        Co-evaluation               AM-PAC PT "6 Clicks" Mobility  Outcome Measure Help needed turning from your back to your side while in a flat bed without using bedrails?: A Lot Help needed moving from lying on your back to sitting on the side of a flat bed without using bedrails?: A Lot Help needed moving to and from a bed to a chair (including a wheelchair)?: A Little Help needed standing up from a chair using your arms (e.g., wheelchair or bedside chair)?: A Little Help needed to walk in hospital room?: A Little Help needed climbing 3-5 steps with a railing? : A Little 6 Click Score: 16    End of Session   Activity Tolerance: Patient tolerated treatment well Patient left: in chair Nurse Communication: Mobility status PT Visit Diagnosis: Difficulty in walking, not elsewhere classified (R26.2)    Time: 1035-1100 PT Time Calculation (min) (ACUTE ONLY): 25 min   Charges:   PT Evaluation $PT Eval Low Complexity: 1 Low PT Treatments $Gait Training: 8-22 mins        Kalliopi Coupland L. Tamala Julian, [place] Pager [phone] 01/11/2019   Galen Manila 01/11/2019, 11:36 AM

## 2019-01-12 ENCOUNTER — Encounter (HOSPITAL_COMMUNITY): Payer: Self-pay | Admitting: Surgery

## 2019-01-12 LAB — BASIC METABOLIC PANEL
Anion gap: 9 (ref 5–15)
BUN: 8 mg/dL (ref 8–23)
CO2: 25 mmol/L (ref 22–32)
Calcium: 8.7 mg/dL — ABNORMAL LOW (ref 8.9–10.3)
Chloride: 105 mmol/L (ref 98–111)
Creatinine, Ser: 0.56 mg/dL (ref 0.44–1.00)
GFR calc Af Amer: >60 mL/min (ref >60)
GFR calc non Af Amer: >60 mL/min (ref >60)
Glucose, Bld: 137 mg/dL — ABNORMAL HIGH (ref 70–99)
Potassium: 3.5 mmol/L (ref 3.5–5.1)
Sodium: 139 mmol/L (ref 135–145)

## 2019-01-12 LAB — GLUCOSE, CAPILLARY
Glucose-Capillary: 108 mg/dL — ABNORMAL HIGH (ref 70–99)
Glucose-Capillary: 93 mg/dL (ref 70–99)
Glucose-Capillary: 93 mg/dL (ref 70–99)
Glucose-Capillary: 138 mg/dL — ABNORMAL HIGH (ref 70–99)

## 2019-01-12 LAB — CBC
HCT: 36.8 % (ref 36.0–46.0)
Hemoglobin: 11.4 g/dL — ABNORMAL LOW (ref 12.0–15.0)
MCH: 26.6 pg (ref 26.0–34.0)
MCHC: 31 g/dL (ref 30.0–36.0)
MCV: 86 fL (ref 80.0–100.0)
Platelets: 234 10*3/uL (ref 150–400)
RBC: 4.28 MIL/uL (ref 3.87–5.11)
RDW: 14.2 % (ref 11.5–15.5)
WBC: 13.4 10*3/uL — ABNORMAL HIGH (ref 4.0–10.5)
nRBC: 0 % (ref 0.0–0.2)

## 2019-01-12 MED ORDER — MAGNESIUM OXIDE 400 (241.3 MG) MG PO TABS
400.0000 mg | ORAL_TABLET | Freq: Every day | ORAL | Status: DC
  Administered 2019-01-12 – 2019-01-13 (×2): 400 mg via ORAL
  Filled 2019-01-12 (×2): qty 1

## 2019-01-12 MED ORDER — METRONIDAZOLE 0.75 % EX GEL
1.0000 "application " | Freq: Every day | CUTANEOUS | Status: DC
  Administered 2019-01-12 – 2019-01-13 (×2): 1 via TOPICAL
  Filled 2019-01-12: qty 45

## 2019-01-12 MED ORDER — SODIUM CHLORIDE 0.9 % IV SOLN: 250.0000 mL | INTRAVENOUS | Status: DC | PRN

## 2019-01-12 MED ORDER — METHOCARBAMOL 500 MG PO TABS: 1000.0000 mg | ORAL_TABLET | Freq: Four times a day (QID) | ORAL | Status: DC | PRN

## 2019-01-12 MED ORDER — MAGNESIUM 400 MG PO TABS: 400.0000 mg | ORAL_TABLET | Freq: Every day | ORAL | Status: DC

## 2019-01-12 MED ORDER — SODIUM CHLORIDE 0.9% FLUSH: 3.0000 mL | INTRAVENOUS | Status: DC | PRN

## 2019-01-12 MED ORDER — METHOCARBAMOL 1000 MG/10ML IJ SOLN: 1000.0000 mg | Freq: Four times a day (QID) | INTRAVENOUS | Status: DC | PRN

## 2019-01-12 MED ORDER — ASPIRIN EC 81 MG PO TBEC
81.0000 mg | DELAYED_RELEASE_TABLET | Freq: Every day | ORAL | Status: DC
  Administered 2019-01-12 – 2019-01-13 (×2): 81 mg via ORAL
  Filled 2019-01-12 (×2): qty 1

## 2019-01-12 MED ORDER — SODIUM CHLORIDE 0.9% FLUSH
3.0000 mL | Freq: Two times a day (BID) | INTRAVENOUS | Status: DC
  Administered 2019-01-12: 12:00:00 3 mL via INTRAVENOUS

## 2019-01-12 MED ORDER — GABAPENTIN 300 MG PO CAPS
300.0000 mg | ORAL_CAPSULE | Freq: Two times a day (BID) | ORAL | Status: DC
  Administered 2019-01-12 – 2019-01-13 (×3): 300 mg via ORAL
  Filled 2019-01-12 (×3): qty 1

## 2019-01-12 NOTE — Progress Notes (Signed)
Kristie Allen [id number] February 15, 1956  CARE TEAM:  PCP: [name]., MD  Outpatient Care Team: Patient Care Team: [name]., MD as PCP - General (Family Medicine)  Inpatient Treatment Team: Treatment Team: Attending Provider: [name], MD; Technician: Burman Blacksmith, NT   Problem List:   Principal Problem:   Diverticulitis of large intestine with perforation and abscess Active Problems:   Diabetes mellitus with neuropathy ([place])   Hyperlipidemia   HTN (hypertension)   History of diverticulitis   Status post laparoscopic-assisted sigmoidectomy   Benign essential hypertension   Esophageal reflux disease   Insomnia   Major depression, recurrent ([place])   Morbid obesity due to excess calories ([place])   2 Days Post-Op  01/10/2019  Procedure(s): XI ROBOT ASSISTED SIGMOIDECTOMY, WITH ICG FLEXIBLE SIGMOIDOSCOPY CYSTOSCOPY WITH URETERAL CATHETER PLACEMENT AND FIREFLY INJECTION    Assessment  Recovering  [facility] Stay = 2 days)  Plan:  -DC Foley -Solid diet.  Follow-up IV fluids.  Improved pain control.  Continue Tylenol.  Add gabapentin.  Add Robaxin.  Narcotic PRN.  Follow-up pathology.  Diabetic control.  Sliding scale insulin.  Hypertension control.  GERD controlled.  Hypothyroidism controlled  -VTE prophylaxis- SCDs, etc -mobilize as tolerated to help recovery  30 minutes spent in review, evaluation, examination, counseling, and coordination of care.  More than 50% of that time was spent in counseling.  01/12/2019    Subjective: (Chief complaint)  Sitting up edge of bed.  Sore.  Trying solid food.  Wants Foley catheter out  Objective:  Vital signs:  Vitals:   01/11/19 1751 01/11/19 2118 01/12/19 0518 01/12/19 0917  BP: 111/60 (!) 102/57 115/67 126/72  Pulse: 67 62 61 70  Resp: 16 14 17    Temp: 97.8 F (36.6 C) 98.7 F (37.1 C) 98.1 F (36.7 C)   TempSrc:  Oral Oral   SpO2: 95% 97% 92%   Weight:   113.4 kg    Height:        Last BM Date: 01/11/19  Intake/Output   Yesterday:  09/25 0701 - 09/26 0700 In: 1956.9 [P.O.:840; I.V.:1116.9] Out: 5050 [Urine:5050] This shift:  Total I/O In: 260 [P.O.:260] Out: 400 [Urine:400]  Bowel function:  Flatus: YES  BM:  No  Drain: (No drain)   Physical Exam:  General: Pt awake/alert/oriented x4 in no acute distress Eyes: PERRL, normal EOM.  Sclera clear.  No icterus Neuro: CN II-XII intact w/o focal sensory/motor deficits. Lymph: No head/neck/groin lymphadenopathy Psych:  No delerium/psychosis/paranoia HENT: Normocephalic, Mucus membranes moist.  No thrush Neck: Supple, No tracheal deviation Chest: No chest wall pain w good excursion CV:  Pulses intact.  Regular rhythm MS: Normal AROM mjr joints.  No obvious deformity  Abdomen: Soft.  Nondistended.  Mildly tender at incisions only.  No evidence of peritonitis.  No incarcerated hernias.  Ext:   No deformity.  No mjr edema.  No cyanosis Skin: No petechiae / purpura  Results:   Cultures: Recent Results (from the past 720 hour(s))  Novel Coronavirus, NAA (Hosp order, Send-out to Ref Lab; TAT 18-24 hrs     Status: None   Collection Time: 01/07/19  3:13 PM   Specimen: Nasopharyngeal Swab; Respiratory  Result Value Ref Range Status   SARS-CoV-2, NAA NOT DETECTED NOT DETECTED Final    Comment: (NOTE) This nucleic acid amplification test was developed and its performance characteristics determined by [employer]. Nucleic acid amplification tests include PCR and TMA. This test has not been FDA cleared  or approved. This test has been authorized by FDA under an Emergency Use Authorization (EUA). This test is only authorized for the duration of time the declaration that circumstances exist justifying the authorization of the emergency use of in vitro diagnostic tests for detection of SARS-CoV-2 virus and/or diagnosis of COVID-19 infection under section 564(b)(1) of the Act, 21 U.S.C.  PT:[id number]) (1), unless the authorization is terminated or revoked sooner. When diagnostic testing is negative, the possibility of a false negative result should be considered in the context of a patient's recent exposures and the presence of clinical signs and symptoms consistent with COVID-19. An individual without symptoms of COVID- 19 and who is not shedding SARS-CoV-2 vi rus would expect to have a negative (not detected) result in this assay. Performed At: [facility] [address] [place], [place] [id number] Rush Farmer MD [id number]    [place]  Final    Comment: Performed at [facility], [place] [address] [place], [place] [address]    Labs: Results for orders placed or performed during the hospital encounter of 01/10/19 (from the past 48 hour(s))  Glucose, capillary     Status: Abnormal   Collection Time: 01/10/19  3:45 PM  Result Value Ref Range   Glucose-Capillary 178 (H) 70 - 99 mg/dL  Glucose, capillary     Status: Abnormal   Collection Time: 01/10/19  5:25 PM  Result Value Ref Range   Glucose-Capillary 173 (H) 70 - 99 mg/dL  Glucose, capillary     Status: Abnormal   Collection Time: 01/10/19  9:40 PM  Result Value Ref Range   Glucose-Capillary 198 (H) 70 - 99 mg/dL  Basic metabolic panel     Status: Abnormal   Collection Time: 01/11/19  3:45 AM  Result Value Ref Range   Sodium 138 135 - 145 mmol/L   Potassium 4.7 3.5 - 5.1 mmol/L   Chloride 106 98 - 111 mmol/L   CO2 23 22 - 32 mmol/L   Glucose, Bld 147 (H) 70 - 99 mg/dL   BUN 14 8 - 23 mg/dL   Creatinine, Ser 1.01 (H) 0.44 - 1.00 mg/dL   Calcium 8.6 (L) 8.9 - 10.3 mg/dL   GFR calc non Af Amer 60 (L) >60 mL/min   GFR calc Af Amer >60 >60 mL/min   Anion gap 9 5 - 15    Comment: Performed at [facility], [place] [address], [place], [place] [address]  CBC     Status: Abnormal   Collection Time: 01/11/19  3:45 AM  Result Value Ref Range   WBC 15.1  (H) 4.0 - 10.5 K/uL   RBC 4.64 3.87 - 5.11 MIL/uL   Hemoglobin 12.2 12.0 - 15.0 g/dL   HCT 39.9 36.0 - 46.0 %   MCV 86.0 80.0 - 100.0 fL   MCH 26.3 26.0 - 34.0 pg   MCHC 30.6 30.0 - 36.0 g/dL   RDW 13.8 11.5 - 15.5 %   Platelets 302 150 - 400 K/uL   nRBC 0.0 0.0 - 0.2 %    Comment: Performed at [facility], [place] [address], [place], [place] [address]  Glucose, capillary     Status: Abnormal   Collection Time: 01/11/19  7:46 AM  Result Value Ref Range   Glucose-Capillary 121 (H) 70 - 99 mg/dL  Glucose, capillary     Status: Abnormal   Collection Time: 01/11/19 12:04 PM  Result Value Ref Range   Glucose-Capillary 138 (H)  70 - 99 mg/dL  Glucose, capillary     Status: None   Collection Time: 01/11/19  4:39 PM  Result Value Ref Range   Glucose-Capillary 99 70 - 99 mg/dL  Glucose, capillary     Status: Abnormal   Collection Time: 01/11/19  9:49 PM  Result Value Ref Range   Glucose-Capillary 101 (H) 70 - 99 mg/dL  Basic metabolic panel     Status: Abnormal   Collection Time: 01/12/19  3:49 AM  Result Value Ref Range   Sodium 139 135 - 145 mmol/L   Potassium 3.5 3.5 - 5.1 mmol/L    Comment: DELTA CHECK NOTED   Chloride 105 98 - 111 mmol/L   CO2 25 22 - 32 mmol/L   Glucose, Bld 137 (H) 70 - 99 mg/dL   BUN 8 8 - 23 mg/dL   Creatinine, Ser 0.56 0.44 - 1.00 mg/dL   Calcium 8.7 (L) 8.9 - 10.3 mg/dL   GFR calc non Af Amer >60 >60 mL/min   GFR calc Af Amer >60 >60 mL/min   Anion gap 9 5 - 15    Comment: Performed at [facility], [place] [address], [place], [place] [address]  CBC     Status: Abnormal   Collection Time: 01/12/19  3:49 AM  Result Value Ref Range   WBC 13.4 (H) 4.0 - 10.5 K/uL   RBC 4.28 3.87 - 5.11 MIL/uL   Hemoglobin 11.4 (L) 12.0 - 15.0 g/dL   HCT 36.8 36.0 - 46.0 %   MCV 86.0 80.0 - 100.0 fL   MCH 26.6 26.0 - 34.0 pg   MCHC 31.0 30.0 - 36.0 g/dL   RDW 14.2 11.5 - 15.5 %   Platelets 234 150 - 400 K/uL   nRBC 0.0 0.0 - 0.2 %     Comment: Performed at [facility], [place] [address], [place], [place] [address]  Glucose, capillary     Status: Abnormal   Collection Time: 01/12/19  7:36 AM  Result Value Ref Range   Glucose-Capillary 108 (H) 70 - 99 mg/dL    Imaging / Studies: Dg C-arm 1-60 Min-no Report  Result Date: 01/10/2019 Fluoroscopy was utilized by the requesting physician.  No radiographic interpretation.    Medications / Allergies: per chart  Antibiotics: Anti-infectives (From admission, onward)   Start     Dose/Rate Route Frequency Ordered Stop   01/10/19 1400  neomycin (MYCIFRADIN) tablet 1,000 mg  Status:  Discontinued     1,000 mg Oral 3 times per day 01/10/19 0901 01/10/19 0903   01/10/19 1400  metroNIDAZOLE (FLAGYL) tablet 1,000 mg  Status:  Discontinued     1,000 mg Oral 3 times per day 01/10/19 0901 01/10/19 0903   01/10/19 0915  cefoTEtan (CEFOTAN) 2 g in sodium chloride 0.9 % 100 mL IVPB     2 g 200 mL/hr over 30 Minutes Intravenous On call to O.R. 01/10/19 0901 01/10/19 1543        Note: Portions of this report may have been transcribed using voice recognition software. Every effort was made to ensure accuracy; however, inadvertent computerized transcription errors may be present.   Any transcriptional errors that result from this process are unintentional.     Adin Hector, MD, FACS, MASCRS Gastrointestinal and Minimally Invasive Surgery    [address], [place],  [address] [phone] Main / Paging [phone] Fax

## 2019-01-13 LAB — BASIC METABOLIC PANEL
Anion gap: 9 (ref 5–15)
BUN: 9 mg/dL (ref 8–23)
CO2: 25 mmol/L (ref 22–32)
Calcium: 8.9 mg/dL (ref 8.9–10.3)
Chloride: 107 mmol/L (ref 98–111)
Creatinine, Ser: 0.51 mg/dL (ref 0.44–1.00)
GFR calc Af Amer: >60 mL/min (ref >60)
GFR calc non Af Amer: >60 mL/min (ref >60)
Glucose, Bld: 137 mg/dL — ABNORMAL HIGH (ref 70–99)
Potassium: 3.6 mmol/L (ref 3.5–5.1)
Sodium: 141 mmol/L (ref 135–145)

## 2019-01-13 LAB — CBC
HCT: 38.5 % (ref 36.0–46.0)
Hemoglobin: 11.8 g/dL — ABNORMAL LOW (ref 12.0–15.0)
MCH: 26.3 pg (ref 26.0–34.0)
MCHC: 30.6 g/dL (ref 30.0–36.0)
MCV: 85.9 fL (ref 80.0–100.0)
Platelets: 239 10*3/uL (ref 150–400)
RBC: 4.48 MIL/uL (ref 3.87–5.11)
RDW: 14 % (ref 11.5–15.5)
WBC: 10.5 10*3/uL (ref 4.0–10.5)
nRBC: 0 % (ref 0.0–0.2)

## 2019-01-13 LAB — GLUCOSE, CAPILLARY: Glucose-Capillary: 126 mg/dL — ABNORMAL HIGH (ref 70–99)

## 2019-01-13 NOTE — Discharge Summary (Signed)
Physician Discharge Summary  Patient ID: Kristie Allen MRN: [id number] DOB/AGE: 1955-08-17 63 y.o.  Admit date: 01/10/2019 Discharge date: 01/13/2019  Admission Diagnoses:  Discharge Diagnoses:  Principal Problem:   Diverticulitis of large intestine with perforation and abscess Active Problems:   Diabetes mellitus with neuropathy ([place])   Hyperlipidemia   HTN (hypertension)   History of diverticulitis   Status post laparoscopic-assisted sigmoidectomy   Benign essential hypertension   Esophageal reflux disease   Insomnia   Major depression, recurrent ([place])   Morbid obesity due to excess calories [facility])   Discharged Condition: good  Hospital Course: uneventful post op recovery.  Quick return of bowel function.  Tolerated po.  Discharged home doing well on POD #3  Consults: None  Significant Diagnostic Studies:   Treatments: surgery: robot assisted LAR  Discharge Exam: Blood pressure 134/79, pulse 71, temperature 98.7 F (37.1 C), temperature source Oral, resp. rate 16, height 5\' 4"  (1.626 m), weight 112.6 kg, SpO2 95 %. General appearance: alert, cooperative and no distress Resp: clear to auscultation bilaterally Cardio: regular rate and rhythm, S1, S2 normal, no murmur, click, rub or gallop Incision/Wound:abdomen soft, NT, incisions clean  Disposition: Discharge disposition: 01-Home or Self Care        Allergies as of 01/13/2019      Reactions   Bisphosphonates Other (See Comments)   Flu symptoms      Medication List    TAKE these medications   acetaminophen 500 MG tablet Commonly known as: TYLENOL Take 1,000 mg by mouth every 6 (six) hours as needed for moderate pain.   aspirin EC 81 MG tablet Take 81 mg by mouth daily.   atorvastatin 80 MG tablet Commonly known as: LIPITOR Take 80 mg by mouth daily.   CALTRATE 600+D3 PO Take 1 tablet by mouth daily.   ezetimibe 10 MG tablet Commonly known as: ZETIA Take 10 mg by mouth daily.   fluticasone 50  MCG/ACT nasal spray Commonly known as: FLONASE Place 1 spray into both nostrils daily.   levothyroxine 125 MCG tablet Commonly known as: SYNTHROID Take 125 mcg by mouth daily before breakfast.   lisinopril-hydrochlorothiazide 20-12.5 MG tablet Commonly known as: ZESTORETIC Take 1 tablet by mouth daily.   Magnesium 400 MG Tabs Take 400 mg by mouth daily.   meloxicam 7.5 MG tablet Commonly known as: MOBIC Take 7.5 mg by mouth daily.   metroNIDAZOLE 0.75 % cream Commonly known as: METROCREAM Apply 1 application topically daily.   pantoprazole 40 MG tablet Commonly known as: PROTONIX Take 40 mg by mouth daily.   senna 8.6 MG Tabs tablet Commonly known as: SENOKOT Take 1 tablet (8.6 mg total) by mouth daily as needed for mild constipation.   sodium chloride 0.9 % injection Inject 10 mLs into the vein daily. Inject 10 cc into drain once daily.   Synjardy XR 25-1000 MG Tb24 Generic drug: Empagliflozin-metFORMIN HCl ER Take 1 tablet by mouth daily.   traMADol 50 MG tablet Commonly known as: Ultram Take 1 tablet (50 mg total) by mouth every 6 (six) hours as needed for up to 5 days (severe postop pain not controlled with tylenol/meloxicam). What changed: reasons to take this      Follow-up Information    [name], MD Follow up in 2 week(s).   Specialty: General Surgery Contact information: [place] [address] [phone]           Signed: Coralie Keens 01/13/2019, 9:02 AM

## 2019-01-13 NOTE — Progress Notes (Signed)
Patient ID: Kristie Allen, female   DOB: 1955-08-01, 62 y.o.   MRN: [id number]  Doing well Tolerating po Having BM's, voiding well Only taking tylenol for pain Abdomen soft Labs normal  Plan: Discharge home

## 2019-01-13 NOTE — Plan of Care (Signed)
Assessment unchanged. Pt verbalized understanding of dc instructions through teach back. Discharged via wc to front entrance to meet awaiting vehicle to carry home.

## 2019-01-14 LAB — SURGICAL PATHOLOGY

## 2019-10-04 ENCOUNTER — Other Ambulatory Visit: Payer: Self-pay

## 2019-10-04 ENCOUNTER — Emergency Department (HOSPITAL_BASED_OUTPATIENT_CLINIC_OR_DEPARTMENT_OTHER): Payer: PRIVATE HEALTH INSURANCE

## 2019-10-04 ENCOUNTER — Emergency Department (HOSPITAL_BASED_OUTPATIENT_CLINIC_OR_DEPARTMENT_OTHER)
Admission: EM | Admit: 2019-10-04 | Discharge: 2019-10-04 | Disposition: A | Discharge status: Home / Self Care | Payer: PRIVATE HEALTH INSURANCE | Attending: Emergency Medicine | Admitting: Emergency Medicine

## 2019-10-04 ENCOUNTER — Encounter (HOSPITAL_BASED_OUTPATIENT_CLINIC_OR_DEPARTMENT_OTHER): Payer: Self-pay | Admitting: Emergency Medicine

## 2019-10-04 DIAGNOSIS — Z7982 Long term (current) use of aspirin: Secondary | ICD-10-CM | POA: Insufficient documentation

## 2019-10-04 DIAGNOSIS — Z79899 Other long term (current) drug therapy: Secondary | ICD-10-CM | POA: Diagnosis not present

## 2019-10-04 DIAGNOSIS — W08XXXA Fall from other furniture, initial encounter: Secondary | ICD-10-CM | POA: Insufficient documentation

## 2019-10-04 DIAGNOSIS — Y9389 Activity, other specified: Secondary | ICD-10-CM | POA: Diagnosis not present

## 2019-10-04 DIAGNOSIS — W19XXXA Unspecified fall, initial encounter: Secondary | ICD-10-CM

## 2019-10-04 DIAGNOSIS — Z87891 Personal history of nicotine dependence: Secondary | ICD-10-CM | POA: Diagnosis not present

## 2019-10-04 DIAGNOSIS — Y9269 Other specified industrial and construction area as the place of occurrence of the external cause: Secondary | ICD-10-CM | POA: Diagnosis not present

## 2019-10-04 DIAGNOSIS — S0003XA Contusion of scalp, initial encounter: Secondary | ICD-10-CM | POA: Insufficient documentation

## 2019-10-04 DIAGNOSIS — S46911A Strain of unspecified muscle, fascia and tendon at shoulder and upper arm level, right arm, initial encounter: Secondary | ICD-10-CM | POA: Diagnosis not present

## 2019-10-04 DIAGNOSIS — E039 Hypothyroidism, unspecified: Secondary | ICD-10-CM | POA: Insufficient documentation

## 2019-10-04 DIAGNOSIS — Y999 Unspecified external cause status: Secondary | ICD-10-CM | POA: Insufficient documentation

## 2019-10-04 DIAGNOSIS — E1140 Type 2 diabetes mellitus with diabetic neuropathy, unspecified: Secondary | ICD-10-CM | POA: Diagnosis not present

## 2019-10-04 DIAGNOSIS — S4991XA Unspecified injury of right shoulder and upper arm, initial encounter: Secondary | ICD-10-CM | POA: Diagnosis present

## 2019-10-04 DIAGNOSIS — Z7984 Long term (current) use of oral hypoglycemic drugs: Secondary | ICD-10-CM | POA: Diagnosis not present

## 2019-10-04 DIAGNOSIS — I10 Essential (primary) hypertension: Secondary | ICD-10-CM | POA: Insufficient documentation

## 2019-10-04 MED ORDER — ACETAMINOPHEN 325 MG PO TABS
650.0000 mg | ORAL_TABLET | Freq: Once | ORAL | Status: AC
  Administered 2019-10-04: 650 mg via ORAL
  Filled 2019-10-04: qty 2

## 2019-10-04 NOTE — ED Triage Notes (Signed)
Pt fell on pavement at 2300. C/o right shoulder and arm pain. Also states she hit her head on car. Denies loc.

## 2019-10-04 NOTE — ED Provider Notes (Signed)
[facility] EMERGENCY DEPARTMENT Provider Note  CSN: [id number] Arrival date & time: 10/04/19 0962  Chief Complaint(s) Fall  HPI Kristie Allen is a 64 y.o. female   The history is provided by the patient.   CC: Shoulder pain  Context: Patient reports mechanical fall.  Reports that she was sitting down with a friend at work on a bench.  There was some wrestling behind them and a bush and they thought it was a fox.  They immediately got up to run away and the patient tripped and fell landing on her right side.  Onset/Duration: 3 hours ago, sudden Timing: Constant Location: Right Quality: Aching/throbbing Severity: Moderate to severe Modifying Factors:  Improved by: Immobility  Worsened by: Range of motion and palpation of the right shoulder girdle muscles Associated Signs/Symptoms:  Pertinent (+): Scalp pain  Pertinent (-): Headache, loss of consciousness, vision changes, focal deficits, chest pain, shortness of breath, midline cervical or back pain.   Past Medical History Past Medical History:  Diagnosis Date  . Diabetes mellitus without complication ([place])    Type II  . Diverticulitis 07/2018  . Diverticulitis of large intestine with perforation 08/05/2018  . GERD (gastroesophageal reflux disease)   . Hypertension   . Hypothyroidism   . Sleep apnea    Patient Active Problem List   Diagnosis Date Noted  . Status post laparoscopic-assisted sigmoidectomy 01/10/2019  . History of diverticulitis 10/15/2018  . HTN (hypertension) 08/02/2018  . Diverticulitis of large intestine with perforation and abscess 08/01/2018  . Thrombophlebitis of superficial veins of right lower extremity 01/04/2018  . Varicose veins of left lower extremity with pain 07/21/2015  . Diabetes mellitus with neuropathy ([place]) 04/04/2015  . Hyperlipidemia 04/04/2015  . Allergic rhinitis 04/04/2015  . Benign essential hypertension 04/04/2015  . Carpal tunnel syndrome of right wrist 04/04/2015    . Esophageal reflux disease 04/04/2015  . Insomnia 04/04/2015  . Major depression, recurrent ([place]) 04/04/2015  . Morbid obesity due to excess calories ([place]) 04/04/2015  . Osteopenia of multiple sites 04/04/2015  . Paresthesia of right arm 04/04/2015  . Plantar fasciitis 04/04/2015  . Venous insufficiency (chronic) (peripheral) 04/04/2015   Home Medication(s) Prior to Admission medications   Medication Sig Start Date End Date Taking? Authorizing Provider  acetaminophen (TYLENOL) 500 MG tablet Take 1,000 mg by mouth every 6 (six) hours as needed for moderate pain.    [provider]  aspirin EC 81 MG tablet Take 81 mg by mouth daily.    [provider]  atorvastatin (LIPITOR) 80 MG tablet Take 80 mg by mouth daily. 07/30/18   [provider]  Calcium Carb-Cholecalciferol (CALTRATE 600+D3 PO) Take 1 tablet by mouth daily.    [provider]  Empagliflozin-metFORMIN HCl ER (SYNJARDY XR) 25-1000 MG TB24 Take 1 tablet by mouth daily.     [provider]  ezetimibe (ZETIA) 10 MG tablet Take 10 mg by mouth daily. 06/13/18   [provider]  fluticasone (FLONASE) 50 MCG/ACT nasal spray Place 1 spray into both nostrils daily.    [provider]  levothyroxine (SYNTHROID, LEVOTHROID) 125 MCG tablet Take 125 mcg by mouth daily before breakfast. 04/23/18   [provider]  lisinopril-hydrochlorothiazide (PRINZIDE,ZESTORETIC) 20-12.5 MG tablet Take 1 tablet by mouth daily. 07/30/18   [provider]  Magnesium 400 MG TABS Take 400 mg by mouth daily.     [provider]  meloxicam (MOBIC) 7.5 MG tablet Take 7.5 mg by mouth daily.  [provider]  metroNIDAZOLE (METROCREAM) 0.75 % cream Apply 1 application topically daily.  07/16/18   [provider]  pantoprazole (PROTONIX) 40 MG tablet Take 40 mg by mouth daily. 04/24/18   [provider]  senna (SENOKOT) 8.6 MG TABS tablet Take 1 tablet (8.6  mg total) by mouth daily as needed for mild constipation. Patient not taking: Reported on 01/02/2019 08/03/18   Domenic Polite, MD  sodium chloride 0.9 % injection Inject 10 mLs into the vein daily. Inject 10 cc into drain once daily. Patient not taking: Reported on 01/02/2019 08/15/18   Liane Comber                                                                                                                                    Past Surgical History Past Surgical History:  Procedure Laterality Date  . CYSTOSCOPY WITH STENT PLACEMENT Bilateral 01/10/2019   Procedure: CYSTOSCOPY WITH URETERAL CATHETER PLACEMENT AND FIREFLY INJECTION;  Surgeon: Raynelle Bring, MD;  Location: [facility]S;  Service: Urology;  Laterality: Bilateral;  . FLEXIBLE SIGMOIDOSCOPY N/A 01/10/2019   Procedure: FLEXIBLE SIGMOIDOSCOPY;  Surgeon: [name], MD;  Location: [facility] OR[facility];  Service: General;  Laterality: N/A;  . IR RADIOLOGIST EVAL & MGMT  08/29/2018  . IR RADIOLOGIST EVAL & MGMT  09/26/2018  . IR RADIOLOGIST EVAL & MGMT  10/24/2018  . IR RADIOLOGIST EVAL & MGMT  11/07/2018  . VARICOSE VEIN SURGERY     Family History Family History  Problem Relation Age of Onset  . Anuerysm Mother   . Diabetes Mother   . Hypertension Mother   . Aneurysm Sister   . Diabetes Sister   . Aneurysm Brother   . Diabetes Brother   . Hypertension Brother   . Diabetes Father   . Heart attack Father   . Hypertension Father   . Colon cancer Neg Hx   . Esophageal cancer Neg Hx   . Rectal cancer Neg Hx   . Stomach cancer Neg Hx     Social History Social History   Tobacco Use  . Smoking status: Former Research scientist (life sciences)  . Smokeless tobacco: Never Used  . Tobacco comment: " back in the 80"s"  Vaping Use  . Vaping Use: Never used  Substance Use Topics  . Alcohol use: Yes    Alcohol/week: 4.0 standard drinks    Types: 4 Shots of liquor per week    Comment: mixed drinks 2 times a week  . Drug use: Never    Allergies Bisphosphonates  Review of Systems Review of Systems All other systems are reviewed and are negative for acute change except as noted in the HPI  Physical Exam Vital Signs  I have reviewed the triage vital signs BP (!) 148/70 (BP Location: Left Arm)   Pulse 71   Temp 98.6 F (37 C) (Oral)   Resp 14   Ht 5\' 3"  (1.6  m)   Wt 117.9 kg   SpO2 96%   BMI 46.06 kg/m   Physical Exam Constitutional:      General: She is not in acute distress.    Appearance: She is well-developed. She is not diaphoretic.  HENT:     Head: Normocephalic. Contusion present. No abrasion or laceration.      Right Ear: External ear normal.     Left Ear: External ear normal.     Nose: Nose normal.  Eyes:     General: No scleral icterus.       Right eye: No discharge.        Left eye: No discharge.     Conjunctiva/sclera: Conjunctivae normal.     Pupils: Pupils are equal, round, and reactive to light.  Cardiovascular:     Rate and Rhythm: Normal rate and regular rhythm.     Pulses:          Radial pulses are 2+ on the right side and 2+ on the left side.       Dorsalis pedis pulses are 2+ on the right side and 2+ on the left side.     Heart sounds: Normal heart sounds. No murmur heard.  No friction rub. No gallop.   Pulmonary:     Effort: Pulmonary effort is normal. No respiratory distress.     Breath sounds: Normal breath sounds. No stridor. No wheezing.  Abdominal:     General: There is no distension.     Palpations: Abdomen is soft.     Tenderness: There is no abdominal tenderness.  Musculoskeletal:     Cervical back: Normal range of motion and neck supple. Tenderness present. No bony tenderness. No spinous process tenderness.     Thoracic back: No bony tenderness.     Lumbar back: No bony tenderness.       Back:     Comments: Clavicles stable. Chest stable to AP/Lat compression. Pelvis stable to Lat compression. No obvious extremity deformity. No chest or abdominal wall  contusion.  Skin:    General: Skin is warm and dry.     Findings: No erythema or rash.  Neurological:     Mental Status: She is alert and oriented to person, place, and time.     Comments: Moving all extremities     ED Results and Treatments Labs (all labs ordered are listed, but only abnormal results are displayed) Labs Reviewed - No data to display                                                                                                                       EKG  EKG Interpretation  Date/Time:    Ventricular Rate:    PR Interval:    QRS Duration:   QT Interval:    QTC Calculation:   R Axis:     Text Interpretation:        Radiology DG Shoulder Right  Result Date: 10/04/2019 CLINICAL DATA:  Fall and  shoulder pain EXAM: RIGHT SHOULDER - 2+ VIEW COMPARISON:  None. FINDINGS: There is no evidence of fracture or dislocation. There is no evidence of arthropathy or other focal bone abnormality. Soft tissues are unremarkable. IMPRESSION: Negative. Electronically Signed   By: Prudencio Pair M.D.   On: 10/04/2019 01:55    Pertinent labs & imaging results that were available during my care of the patient were reviewed by me and considered in my medical decision making (see chart for details).  Medications Ordered in ED Medications  acetaminophen (TYLENOL) tablet 650 mg (650 mg Oral Given 10/04/19 0203)                                                                                                                                    Procedures Procedures  (including critical care time)  Medical Decision Making / ED Course I have reviewed the nursing notes for this encounter and the patient's prior records (if available in EHR or on provided paperwork).   Kristie Allen was evaluated in Emergency Department on 10/04/2019 for the symptoms described in the history of present illness. She was evaluated in the context of the global COVID-19 pandemic, which necessitated consideration  that the patient might be at risk for infection with the SARS-CoV-2 virus that causes COVID-19. Institutional protocols and algorithms that pertain to the evaluation of patients at risk for COVID-19 are in a state of rapid change based on information released by regulatory bodies including the CDC and federal and state organizations. These policies and algorithms were followed during the patient's care in the ED.  Mechanical fall resulting in contusion to the occipital scalp and right shoulder pain. Plain film negative for fracture or acute dislocation of the right shoulder.  No other injuries noted on exam requiring imaging or work-up at this time. Likely muscle strain from the fall. Supportive management recommended.      Final Clinical Impression(s) / ED Diagnoses Final diagnoses:  Fall, initial encounter  Contusion of scalp, initial encounter  Strain of right shoulder, initial encounter    The patient appears reasonably screened and/or stabilized for discharge and I doubt any other medical condition or other [facility] requiring further screening, evaluation, or treatment in the ED at this time prior to discharge. Safe for discharge with strict return precautions.  Disposition: Discharge  Condition: Good  I have discussed the results, Dx and Tx plan with the patient/family who expressed understanding and agree(s) with the plan. Discharge instructions discussed at length. The patient/family was given strict return precautions who verbalized understanding of the instructions. No further questions at time of discharge.    ED Discharge Orders    None      Follow Up: [name]., MD [address] [place] [address] [phone]  Schedule an appointment as soon as possible for a visit  If symptoms do not improve or  worsen in 1-2 weeks  This chart was dictated using voice recognition software.  Despite best efforts to proofread,  errors can occur which can change the  documentation meaning.   Fatima Blank, MD 10/04/19 [phone]

## 2019-10-04 NOTE — Discharge Instructions (Signed)
You may use over-the-counter Motrin (Ibuprofen), Acetaminophen (Tylenol), topical muscle creams such as SalonPas, [employer], Bengay, etc. Please stretch, apply ice for 2 days then heat, and have massage therapy for additional assistance.

## 2019-10-04 NOTE — ED Notes (Signed)
Wound care to right forearm abrasion.

## 2020-04-18 DIAGNOSIS — M810 Age-related osteoporosis without current pathological fracture: Secondary | ICD-10-CM

## 2020-04-18 HISTORY — DX: Age-related osteoporosis without current pathological fracture: M81.0

## 2021-01-02 IMAGING — RF ABSCESS INJECTION
4 series · 10 of 10 positions shown · non-contrast
Comparison: none

INDICATION: 62-year-old female with abscess

[Series 1: one shot · 1 of 1 slices shown (1 of 2)]
[im 1/1]
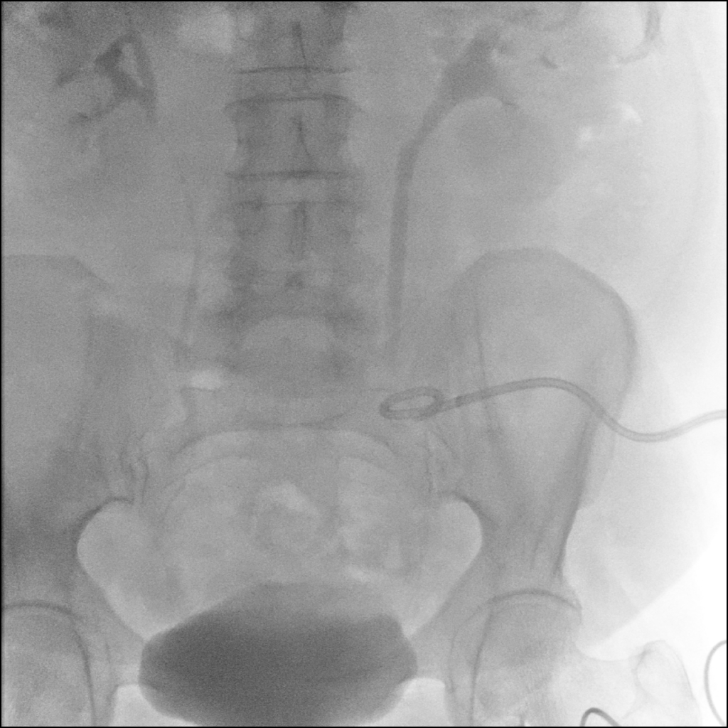

[Series 2: sequence · 4 of 119 frames shown (1 of 2)]
[frame 18/119]
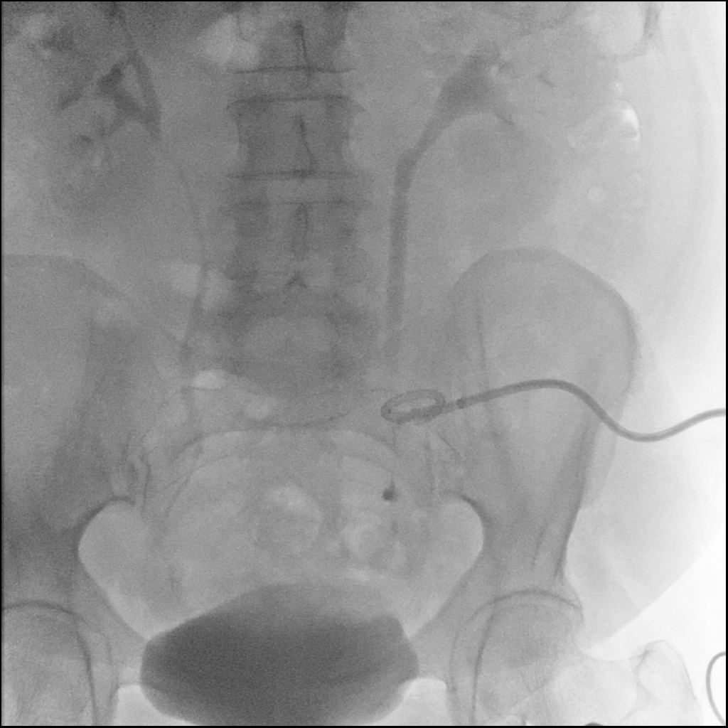
[frame 60/119]
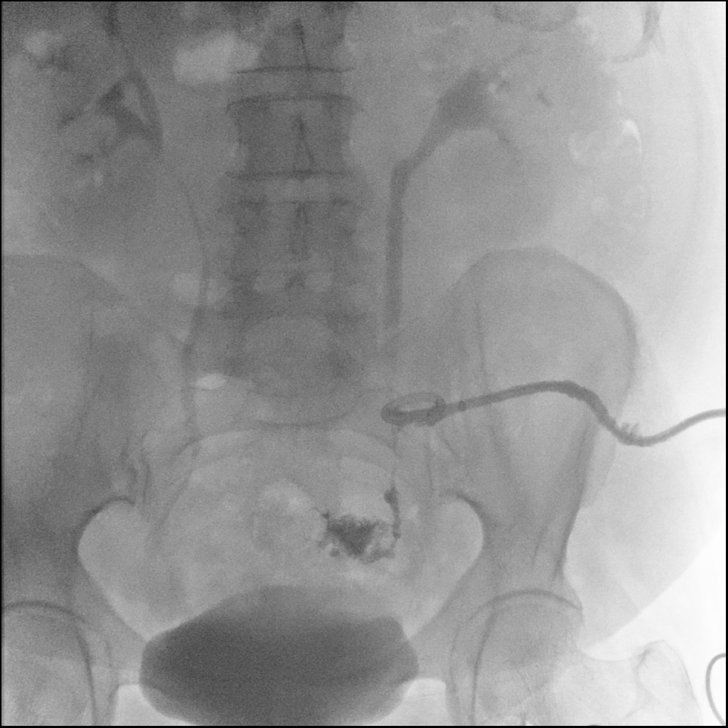
[frame 94/119]
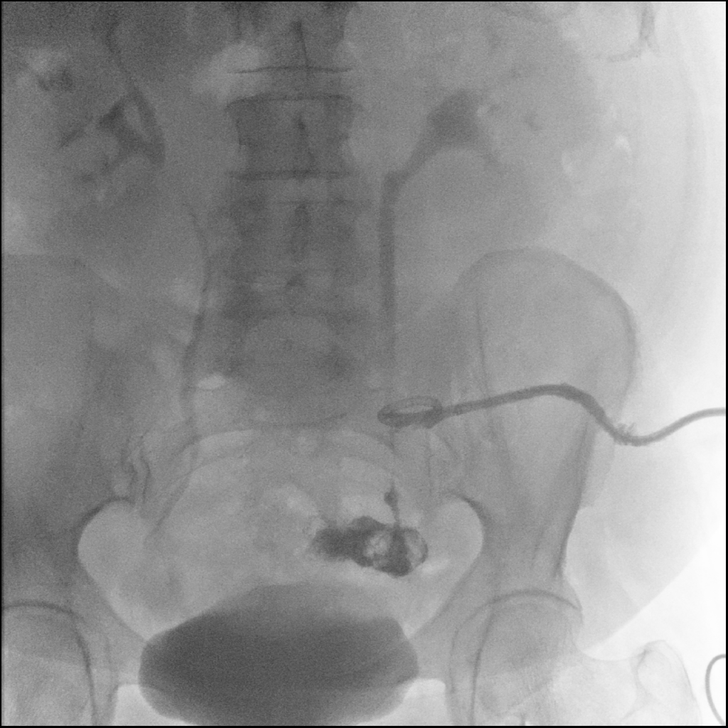
[frame 102/119]
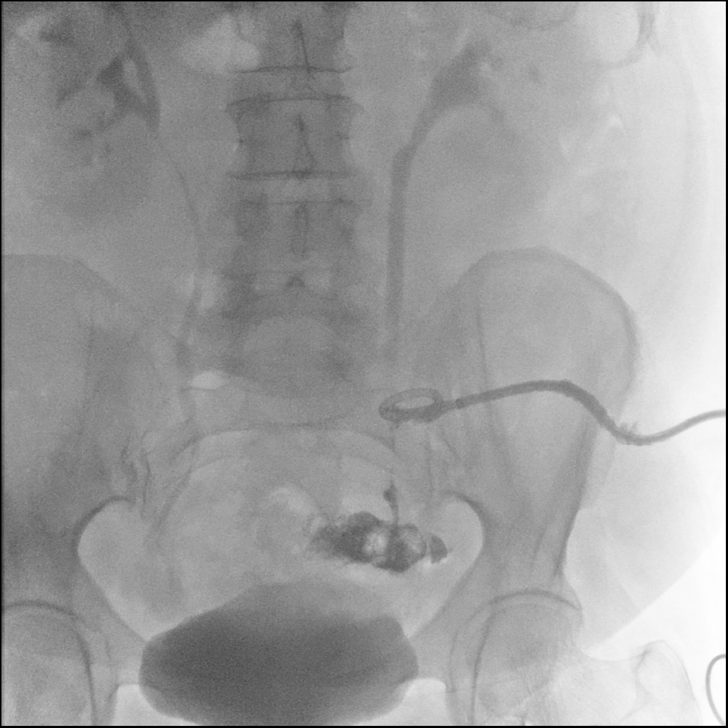

[Series 3: one shot · 1 of 1 slices shown (2 of 2)]
[im 1/1]
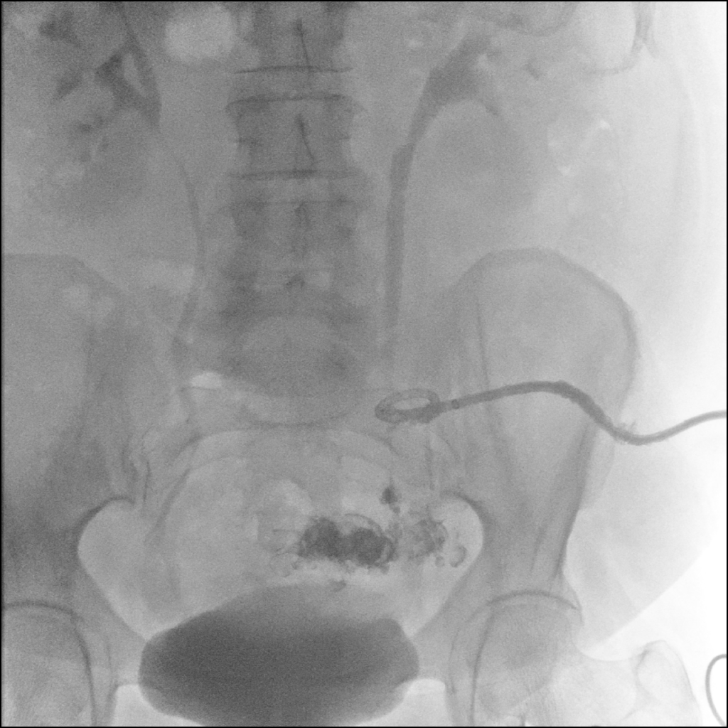

[Series 4: sequence · 4 of 10 frames shown (2 of 2)]
[frame 1/10]
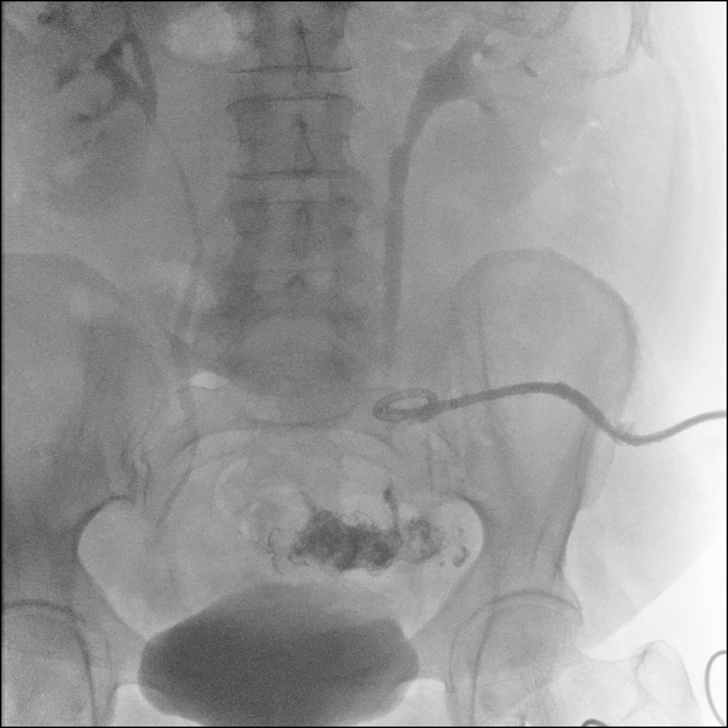
[frame 2/10]
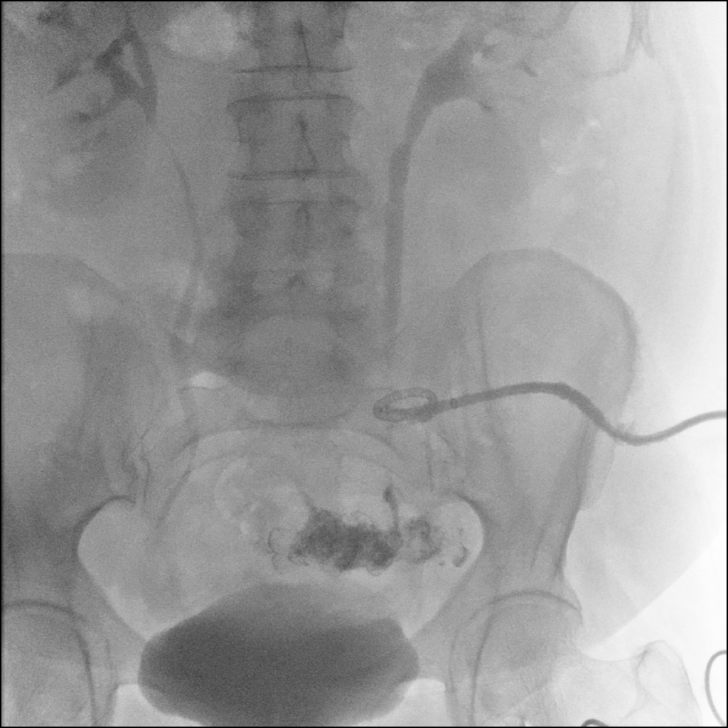
[frame 6/10]
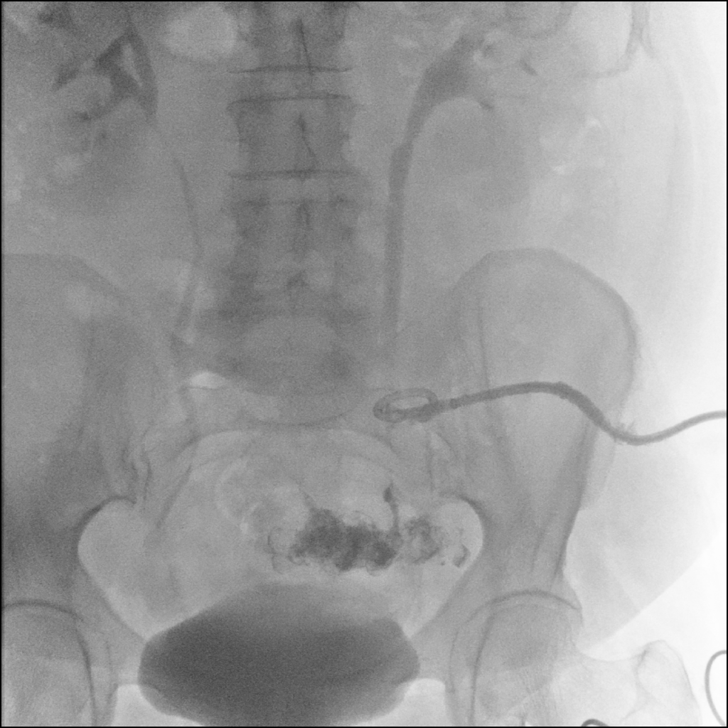
[frame 9/10]
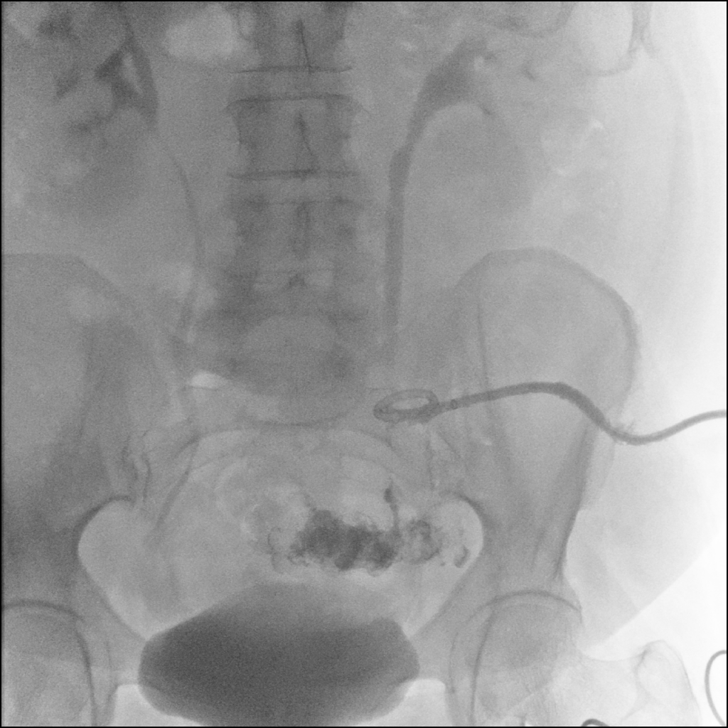

[10 of 10 positions shown; findings below may reference images not displayed]

EXAM:
IMAGE GUIDED DRAIN INJECTION

MEDICATIONS:
None

ANESTHESIA/SEDATION:
None

COMPLICATIONS:
None none

PROCEDURE:
Informed written consent was obtained from the patient after a
thorough discussion of the procedural risks, benefits and
alternatives. All questions were addressed. Maximal Sterile Barrier
Technique was utilized including caps, mask, sterile gowns, sterile
gloves, sterile drape, hand hygiene and skin antiseptic. A timeout
was performed prior to the initiation of the procedure.

Patient positioned supine position on fluoroscopy table. Standard
prep of the abdomen and the indwelling catheter were performed.

Scout images were acquired.

Contrast was gently injected through the pigtail drainage catheter
of the left abdomen. Contrast filled a very thin fistulous tract
extending inferiorly into the sigmoid colon.

Catheter was attached to gravity drainage.

Patient tolerated the procedure well and remained hemodynamically
stable throughout.

No complications were encountered and no significant blood loss.
IMPRESSION: Left abdominal drain injection confirms a thin fistulous tract from
the drainage catheter to the sigmoid colon

## 2021-11-24 ENCOUNTER — Encounter: Payer: Self-pay | Admitting: Gastroenterology

## 2021-11-30 ENCOUNTER — Encounter: Payer: Self-pay | Admitting: Gastroenterology

## 2021-12-22 ENCOUNTER — Ambulatory Visit (AMBULATORY_SURGERY_CENTER): Payer: Self-pay

## 2021-12-22 VITALS — Ht 63.0 in | Wt 253.0 lb

## 2021-12-22 DIAGNOSIS — Z86010 Personal history of colonic polyps: Secondary | ICD-10-CM

## 2021-12-22 MED ORDER — NA SULFATE-K SULFATE-MG SULF 17.5-3.13-1.6 GM/177ML PO SOLN: 1.0000 | Freq: Once | ORAL | 0 refills | Status: AC

## 2021-12-22 NOTE — Progress Notes (Signed)
No egg or soy allergy known to patient   No issues known to pt with past sedation with any surgeries or procedures  Patient denies ever being told they had issues or difficulty with intubation   No FH of Malignant Hyperthermia  Pt is not on diet pills  Pt is not on  home 02   Pt is not on blood thinners   Pt has issues with constipation - pt to start taking miralax daily and then up to 2 times per day for 5 days before the colonoscopy    No A fib or A flutter   Have any cardiac testing pen ding--no Pt instructed to use Singlecare.com or GoodRx for a price reduction on prep

## 2022-01-04 ENCOUNTER — Encounter: Payer: Self-pay | Admitting: Gastroenterology

## 2022-01-20 ENCOUNTER — Ambulatory Visit (AMBULATORY_SURGERY_CENTER): Payer: No Typology Code available for payment source | Admitting: Gastroenterology

## 2022-01-20 ENCOUNTER — Encounter: Payer: Self-pay | Admitting: Gastroenterology

## 2022-01-20 VITALS — BP 135/78 | HR 70 | Temp 98.6°F | Resp 14 | Ht 63.0 in | Wt 253.0 lb

## 2022-01-20 DIAGNOSIS — Z09 Encounter for follow-up examination after completed treatment for conditions other than malignant neoplasm: Secondary | ICD-10-CM | POA: Diagnosis not present

## 2022-01-20 DIAGNOSIS — D128 Benign neoplasm of rectum: Secondary | ICD-10-CM

## 2022-01-20 DIAGNOSIS — Z86010 Personal history of colonic polyps: Secondary | ICD-10-CM | POA: Diagnosis not present

## 2022-01-20 DIAGNOSIS — K621 Rectal polyp: Secondary | ICD-10-CM

## 2022-01-20 DIAGNOSIS — D123 Benign neoplasm of transverse colon: Secondary | ICD-10-CM | POA: Diagnosis not present

## 2022-01-20 MED ORDER — SODIUM CHLORIDE 0.9 % IV SOLN: 500.0000 mL | Freq: Once | INTRAVENOUS | Status: DC

## 2022-01-20 NOTE — Progress Notes (Signed)
Pt's states no medical or surgical changes since previsit or office visit. 

## 2022-01-20 NOTE — Progress Notes (Signed)
A and O x3. Report to RN. Tolerated MAC anesthesia well. 

## 2022-01-20 NOTE — Progress Notes (Signed)
History & Physical  Primary Care Physician:  [name]., MD Primary Gastroenterologist: Lucio Edward, MD  CHIEF COMPLAINT:  Personal history of colon polyps   HPI: Kristie Allen is a 66 y.o. female with a personal history of multiple adenomatous colon polyps for surveillance colonoscopy.   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   GERD (gastroesophageal reflux disease)    Hypertension    Hypothyroidism    Osteoporosis 2022   takes prolia   Sleep apnea     Past Surgical History:  Procedure Laterality Date   COLONOSCOPY  10/2018   CYSTOSCOPY WITH STENT PLACEMENT Bilateral 01/10/2019   Procedure: CYSTOSCOPY WITH URETERAL CATHETER PLACEMENT AND FIREFLY INJECTION;  Surgeon: Raynelle Bring, MD;  Location: [facility];  Service: Urology;  Laterality: Bilateral;   FLEXIBLE SIGMOIDOSCOPY N/A 01/10/2019   Procedure: FLEXIBLE SIGMOIDOSCOPY;  Surgeon: [name], MD;  Location: [facility];  Service: General;  Laterality: N/A;   IR RADIOLOGIST EVAL & MGMT  08/29/2018   IR RADIOLOGIST EVAL & MGMT  09/26/2018   IR RADIOLOGIST EVAL & MGMT  10/24/2018   IR RADIOLOGIST EVAL & MGMT  11/07/2018   VARICOSE VEIN SURGERY      Prior to Admission medications   Medication Sig Start Date End Date Taking? Authorizing Provider  acetaminophen (TYLENOL) 500 MG tablet Take 1,000 mg by mouth every 6 (six) hours as needed for moderate pain.   Yes [provider]  atorvastatin (LIPITOR) 80 MG tablet Take 80 mg by mouth daily. 07/30/18  Yes [provider]  blood glucose meter kit and supplies Use to check blood sugar once a day 12/08/20  Yes [provider]  Calcium Carb-Cholecalciferol (CALTRATE 600+D3 PO) Take 1 tablet by mouth daily.   Yes [provider]  DULoxetine (CYMBALTA) 60 MG capsule Take 60 mg by mouth daily. 10/27/21  Yes [provider]  Empagliflozin-metFORMIN HCl ER (SYNJARDY XR) 25-1000 MG TB24 Take 1 tablet by mouth daily.    Yes [provider]  ezetimibe (ZETIA) 10 MG tablet Take 10 mg by mouth daily. 06/13/18  Yes [provider]  fluticasone (FLONASE) 50 MCG/ACT nasal spray Place 1 spray into both nostrils daily.   Yes [provider]  glucose blood (CONTOUR NEXT TEST) test strip 1 strip by Misc.(Non-Drug; Combo Route) route daily. 04/23/21  Yes [provider]  Lancets MISC Use to check blood sugar QD 12/08/20  Yes [provider]  levothyroxine (SYNTHROID, LEVOTHROID) 125 MCG tablet Take 125 mcg by mouth daily before breakfast. 04/23/18  Yes [provider]  lisinopril-hydrochlorothiazide (PRINZIDE,ZESTORETIC) 20-12.5 MG tablet Take 1 tablet by mouth daily. 07/30/18  Yes [provider]  Magnesium 400 MG TABS Take 400 mg by mouth daily.    Yes [provider]  meloxicam (MOBIC) 15 MG tablet Take 7.5-15 mg by mouth daily. 09/09/21  Yes [provider]  meloxicam (MOBIC) 7.5 MG tablet Take 7.5 mg by mouth daily.   Yes [provider]  metFORMIN (GLUCOPHAGE) 1000 MG tablet Take 1,000 mg by mouth daily. 12/21/21  Yes [provider]  pantoprazole (PROTONIX) 40 MG tablet Take 40 mg by mouth daily. 04/24/18  Yes [provider]  TRULICITY 1.5 BP/1.0CH SOPN Inject 3 mg into the skin  once a week. 10/08/21  Yes [provider]  aspirin EC 81 MG tablet Take 81 mg by mouth daily.    [provider]  metroNIDAZOLE (METROCREAM) 0.75 % cream Apply 1 application topically daily.  07/16/18   [provider]  PROLIA 60 MG/ML SOSY injection Inject into the skin. 10/06/21   [provider]  senna (SENOKOT) 8.6 MG TABS tablet Take 1 tablet (8.6 mg total) by mouth daily as needed for mild  constipation. 08/03/18   Domenic Polite, MD  zolpidem (AMBIEN) 10 MG tablet Take 10 mg by mouth at bedtime as needed. 11/19/21   [provider]    Current Outpatient Medications  Medication Sig Dispense Refill   acetaminophen (TYLENOL) 500 MG tablet Take 1,000 mg by mouth every 6 (six) hours as needed for moderate pain.     atorvastatin (LIPITOR) 80 MG tablet Take 80 mg by mouth daily.     blood glucose meter kit and supplies Use to check blood sugar once a day     Calcium Carb-Cholecalciferol (CALTRATE 600+D3 PO) Take 1 tablet by mouth daily.     DULoxetine (CYMBALTA) 60 MG capsule Take 60 mg by mouth daily.     Empagliflozin-metFORMIN HCl ER (SYNJARDY XR) 25-1000 MG TB24 Take 1 tablet by mouth daily.      ezetimibe (ZETIA) 10 MG tablet Take 10 mg by mouth daily.     fluticasone (FLONASE) 50 MCG/ACT nasal spray Place 1 spray into both nostrils daily.     glucose blood (CONTOUR NEXT TEST) test strip 1 strip by Misc.(Non-Drug; Combo Route) route daily.     Lancets MISC Use to check blood sugar QD     levothyroxine (SYNTHROID, LEVOTHROID) 125 MCG tablet Take 125 mcg by mouth daily before breakfast.     lisinopril-hydrochlorothiazide (PRINZIDE,ZESTORETIC) 20-12.5 MG tablet Take 1 tablet by mouth daily.     Magnesium 400 MG TABS Take 400 mg by mouth daily.      meloxicam (MOBIC) 15 MG tablet Take 7.5-15 mg by mouth daily.     meloxicam (MOBIC) 7.5 MG tablet Take 7.5 mg by mouth daily.     metFORMIN (GLUCOPHAGE) 1000 MG tablet Take 1,000 mg by mouth daily.     pantoprazole (PROTONIX) 40 MG tablet Take 40 mg by mouth daily.     TRULICITY 1.5 XB/3.5HG SOPN Inject 3 mg into the skin once a week.     aspirin EC 81 MG tablet Take 81 mg by mouth daily.     metroNIDAZOLE (METROCREAM) 0.75 % cream Apply 1 application topically daily.      PROLIA 60 MG/ML SOSY injection Inject into the skin.     senna (SENOKOT) 8.6 MG TABS tablet Take 1 tablet (8.6 mg total) by mouth daily as needed for mild  constipation. 15 each 0   zolpidem (AMBIEN) 10 MG tablet Take 10 mg by mouth at bedtime as needed.     Current Facility-Administered Medications  Medication Dose Route Frequency Provider Last Rate Last Admin   0.9 %  sodium chloride infusion  500 mL Intravenous Once Ladene Artist, MD        Allergies as of 01/20/2022 - Review Complete 01/20/2022  Allergen Reaction Noted   Bisphosphonates Other (See Comments) 04/04/2015    Family History  Problem Relation Age of Onset   Anuerysm Mother    Diabetes Mother    Hypertension Mother    Diabetes Father    Heart attack Father    Hypertension Father  Colon polyps Sister    Aneurysm Sister    Diabetes Sister    Aneurysm Brother    Diabetes Brother    Hypertension Brother    Colon cancer Neg Hx    Esophageal cancer Neg Hx    Rectal cancer Neg Hx    Stomach cancer Neg Hx     Social History   Socioeconomic History   Marital status: Widowed    Spouse name: Not on file   Number of children: Not on file   Years of education: Not on file   Highest education level: Not on file  Occupational History   Not on file  Tobacco Use   Smoking status: Former    Packs/day: 0.50    Years: 5.00    Total pack years: 2.50    Types: Cigarettes    Quit date: 73    Years since quitting: 38.7   Smokeless tobacco: Never   Tobacco comments:    " back in the 80"s"  Vaping Use   Vaping Use: Never used  Substance and Sexual Activity   Alcohol use: Yes    Alcohol/week: 4.0 standard drinks of alcohol    Types: 4 Shots of liquor per week    Comment: mixed drinks 2 times a week   Drug use: Never   Sexual activity: Yes    Birth control/protection: Post-menopausal  Other Topics Concern   Not on file  Social History Narrative   Not on file   Social Determinants of Health   Financial Resource Strain: Not on file  Food Insecurity: Not on file  Transportation Needs: Not on file  Physical Activity: Not on file  Stress: Not on file   Social Connections: Not on file  Intimate Partner Violence: Not on file    Review of Systems:  All systems reviewed were negative except where noted in HPI.   Physical Exam: General:  Alert, well-developed, in NAD Head:  Normocephalic and atraumatic. Eyes:  Sclera clear, no icterus.   Conjunctiva pink. Ears:  Normal auditory acuity. Mouth:  No deformity or lesions.  Neck:  Supple; no masses . Lungs:  Clear throughout to auscultation.   No wheezes, crackles, or rhonchi. No acute distress. Heart:  Regular rate and rhythm; no murmurs. Abdomen:  Soft, nondistended, nontender. No masses, hepatomegaly. No obvious masses.  Normal bowel .    Rectal:  Deferred   Msk:  Symmetrical without gross deformities.. Pulses:  Normal pulses noted. Extremities:  Without edema. Neurologic:  Alert and  oriented x4;  grossly normal neurologically. Skin:  Intact without significant lesions or rashes. Cervical Nodes:  No significant cervical adenopathy. Psych:  Alert and cooperative. Normal mood and affect.   Impression / Plan:   Personal history of multiple adenomatous colon polyps for surveillance colonoscopy.  Pricilla Riffle. Fuller Plan  01/20/2022, 10:34 AM See Shea Evans, [facility], to contact our on call provider

## 2022-01-20 NOTE — Progress Notes (Signed)
Called to room to assist during endoscopic procedure.  Patient ID and intended procedure confirmed with present staff. Received instructions for my participation in the procedure from the performing physician.  

## 2022-01-20 NOTE — Patient Instructions (Signed)
Information on polyps and hemorrhoids given to you today.  Await pathology results.  Resume previous diet and medications.    YOU HAD AN ENDOSCOPIC PROCEDURE TODAY AT THE [facility]:   Refer to the procedure report that was given to you for any specific questions about what was found during the examination.  If the procedure report does not answer your questions, please call your gastroenterologist to clarify.  If you requested that your care partner not be given the details of your procedure findings, then the procedure report has been included in a sealed envelope for you to review at your convenience later.  YOU SHOULD EXPECT: Some feelings of bloating in the abdomen. Passage of more gas than usual.  Walking can help get rid of the air that was put into your GI tract during the procedure and reduce the bloating. If you had a lower endoscopy (such as a colonoscopy or flexible sigmoidoscopy) you may notice spotting of blood in your stool or on the toilet paper. If you underwent a bowel prep for your procedure, you may not have a normal bowel movement for a few days.  Please Note:  You might notice some irritation and congestion in your nose or some drainage.  This is from the oxygen used during your procedure.  There is no need for concern and it should clear up in a day or so.  SYMPTOMS TO REPORT IMMEDIATELY:  Following lower endoscopy (colonoscopy or flexible sigmoidoscopy):  Excessive amounts of blood in the stool  Significant tenderness or worsening of abdominal pains  Swelling of the abdomen that is new, acute  Fever of 100F or higher   For urgent or emergent issues, a gastroenterologist can be reached at any hour by calling [phone]. Do not use MyChart messaging for urgent concerns.    DIET:  We do recommend a small meal at first, but then you may proceed to your regular diet.  Drink plenty of fluids but you should avoid alcoholic beverages for 24  hours.  ACTIVITY:  You should plan to take it easy for the rest of today and you should NOT DRIVE or use heavy machinery until tomorrow (because of the sedation medicines used during the test).    FOLLOW UP: Our staff will call the number listed on your records the next business day following your procedure.  We will call around 7:15- 8:00 am to check on you and address any questions or concerns that you may have regarding the information given to you following your procedure. If we do not reach you, we will leave a message.     If any biopsies were taken you will be contacted by phone or by letter within the next 1-3 weeks.  Please call us at [phone] if you have not heard about the biopsies in 3 weeks.    SIGNATURES/CONFIDENTIALITY: You and/or your care partner have signed paperwork which will be entered into your electronic medical record.  These signatures attest to the fact that that the information above on your After Visit Summary has been reviewed and is understood.  Full responsibility of the confidentiality of this discharge information lies with you and/or your care-partner. 

## 2022-01-20 NOTE — Op Note (Signed)
[facility] Patient Name: Kristie Allen Procedure Date: 01/20/2022 10:28 AM MRN: [id number] Endoscopist: Ladene Artist , MD Age: 66 Referring MD:  Date of Birth: [birth year] Gender: Female Account #: [id number] Procedure:                Colonoscopy Indications:              Surveillance: Personal history of adenomatous                            polyps on last colonoscopy 3 years ago Medicines:                Monitored Anesthesia Care Procedure:                Pre-Anesthesia Assessment:                           - Prior to the procedure, a History and Physical                            was performed, and patient medications and                            allergies were reviewed. The patient's tolerance of                            previous anesthesia was also reviewed. The risks                            and benefits of the procedure and the sedation                            options and risks were discussed with the patient.                            All questions were answered, and informed consent                            was obtained. Prior Anticoagulants: The patient has                            taken no previous anticoagulant or antiplatelet                            agents. ASA Grade Assessment: III - A patient with                            severe systemic disease. After reviewing the risks                            and benefits, the patient was deemed in                            satisfactory condition to undergo the procedure.  After obtaining informed consent, the colonoscope                            was passed under direct vision. Throughout the                            procedure, the patient's blood pressure, pulse, and                            oxygen saturations were monitored continuously. The                            Olympus CF-HQ190L ([id number]) Colonoscope was                            introduced through the anus  and advanced to the the                            cecum, identified by appendiceal orifice and                            ileocecal valve. The ileocecal valve, appendiceal                            orifice, and rectum were photographed. The quality                            of the bowel preparation was good. The colonoscopy                            was performed without difficulty. The patient                            tolerated the procedure well. Scope In: 10:38:51 AM Scope Out: 10:57:24 AM Scope Withdrawal Time: 0 hours 15 minutes 16 seconds  Total Procedure Duration: 0 hours 18 minutes 33 seconds  Findings:                 The perianal and digital rectal examinations were                            normal.                           Six sessile polyps were found in the rectum (2) and                            transverse colon (4). The polyps were 5 to 7 mm in                            size. These polyps were removed with a cold snare.                            Resection and retrieval were complete.  There was evidence of a prior end-to-end                            colo-colonic anastomosis in the sigmoid colon. This                            was patent and was characterized by healthy                            appearing mucosa. The anastomosis was traversed.                           Internal hemorrhoids were found during                            retroflexion. The hemorrhoids were small and Grade                            I (internal hemorrhoids that do not prolapse).                           The exam was otherwise without abnormality on                            direct and retroflexion views. Complications:            No immediate complications. Estimated blood loss:                            None. Estimated Blood Loss:     Estimated blood loss: none. Impression:               - Six 5 to 7 mm polyps in the rectum and in the                             transverse colon, removed with a cold snare.                            Resected and retrieved.                           - Patent end-to-end colo-colonic anastomosis,                            characterized by healthy appearing mucosa.                           - Internal hemorrhoids.                           - The examination was otherwise normal on direct                            and retroflexion views. Recommendation:           - Repeat colonoscopy, likely 3 years, after studies  are complete for surveillance of multiple polyps.                           - Patient has a contact number available for                            emergencies. The signs and symptoms of potential                            delayed complications were discussed with the                            patient. Return to normal activities tomorrow.                            Written discharge instructions were provided to the                            patient.                           - Resume previous diet.                           - Continue present medications.                           - Await pathology results. Ladene Artist, MD 01/20/2022 11:01:25 AM This report has been signed electronically.

## 2022-01-21 ENCOUNTER — Telehealth: Payer: Self-pay

## 2022-01-21 NOTE — Telephone Encounter (Signed)
Left message on answering machine. 

## 2022-02-08 ENCOUNTER — Encounter: Payer: Self-pay | Admitting: Gastroenterology
